# Patient Record
Sex: Female | Born: 1990 | Race: Black or African American | Hispanic: No | Marital: Single | Smoking: Current every day smoker
Health system: Southern US, Community
[De-identification: ages and names within clinical notes are randomized; demographics above are authoritative.]

## PROBLEM LIST (undated history)

## (undated) DIAGNOSIS — O2230 Deep phlebothrombosis in pregnancy, unspecified trimester: Secondary | ICD-10-CM

## (undated) DIAGNOSIS — F419 Anxiety disorder, unspecified: Secondary | ICD-10-CM

## (undated) HISTORY — PX: EYE SURGERY: SHX253

## (undated) HISTORY — PX: TONSILLECTOMY: SUR1361

---
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
## 2008-06-09 ENCOUNTER — Emergency Department (HOSPITAL_COMMUNITY): Admission: EM | Admit: 2008-06-09 | Discharge: 2008-06-09 | Payer: Self-pay | Admitting: Emergency Medicine

## 2008-08-25 ENCOUNTER — Emergency Department (HOSPITAL_COMMUNITY): Admission: EM | Admit: 2008-08-25 | Discharge: 2008-08-25 | Payer: Self-pay | Admitting: Emergency Medicine

## 2008-09-01 ENCOUNTER — Emergency Department (HOSPITAL_COMMUNITY): Admission: EM | Admit: 2008-09-01 | Discharge: 2008-09-01 | Payer: Self-pay | Admitting: Emergency Medicine

## 2008-11-02 ENCOUNTER — Emergency Department (HOSPITAL_COMMUNITY): Admission: EM | Admit: 2008-11-02 | Discharge: 2008-11-02 | Payer: Self-pay | Admitting: Family Medicine

## 2009-04-05 ENCOUNTER — Emergency Department (HOSPITAL_COMMUNITY): Admission: EM | Admit: 2009-04-05 | Discharge: 2009-04-05 | Payer: Self-pay | Admitting: Family Medicine

## 2010-01-13 ENCOUNTER — Emergency Department (HOSPITAL_COMMUNITY): Admission: EM | Admit: 2010-01-13 | Discharge: 2010-01-13 | Payer: Self-pay | Admitting: Emergency Medicine

## 2010-01-18 ENCOUNTER — Emergency Department (HOSPITAL_COMMUNITY): Admission: EM | Admit: 2010-01-18 | Discharge: 2010-01-18 | Payer: Self-pay | Admitting: Family Medicine

## 2011-01-22 ENCOUNTER — Inpatient Hospital Stay (INDEPENDENT_AMBULATORY_CARE_PROVIDER_SITE_OTHER)
Admission: RE | Admit: 2011-01-22 | Discharge: 2011-01-22 | Disposition: A | Discharge status: Home / Self Care | Payer: Self-pay | Source: Ambulatory Visit | Attending: Family Medicine | Admitting: Family Medicine

## 2011-01-22 ENCOUNTER — Other Ambulatory Visit: Payer: Self-pay | Admitting: Family Medicine

## 2011-01-22 DIAGNOSIS — A599 Trichomoniasis, unspecified: Secondary | ICD-10-CM

## 2011-01-22 DIAGNOSIS — N9489 Other specified conditions associated with female genital organs and menstrual cycle: Secondary | ICD-10-CM

## 2011-01-22 DIAGNOSIS — N760 Acute vaginitis: Secondary | ICD-10-CM

## 2011-01-24 LAB — POCT URINALYSIS DIPSTICK
Bilirubin Urine: NEGATIVE
Glucose, UA: NEGATIVE mg/dL
Ketones, ur: NEGATIVE mg/dL
pH: 7 (ref 5.0–8.0)

## 2011-01-24 LAB — URINE CULTURE

## 2011-02-09 LAB — POCT RAPID STREP A (OFFICE): Streptococcus, Group A Screen (Direct): NEGATIVE

## 2011-02-24 LAB — WET PREP, GENITAL: Yeast Wet Prep HPF POC: NONE SEEN

## 2011-02-24 LAB — GC/CHLAMYDIA PROBE AMP, GENITAL
Chlamydia, DNA Probe: NEGATIVE
GC Probe Amp, Genital: NEGATIVE

## 2011-04-08 NOTE — H&P (Signed)
NAME:  Swiech, Rickie          ACCOUNT NO.:  [id number]   MEDICAL RECORD NO.:  [id number]          PATIENT TYPE:  INP   LOCATION:  9164                          FACILITY:  [facility]   PHYSICIAN:  Janine Limbo, M.D.DATE OF BIRTH:  [birth year]   DATE OF ADMISSION:  11/13/2006  DATE OF DISCHARGE:                              HISTORY & PHYSICAL   HISTORY OF PRESENT ILLNESS:  Ms. Nancy Williamson is a 20 year old single black  female, primigravida at 40-6/7 weeks, who presents with regular uterine  contractions tonight.  She denies leaking and denies bleeding.  No signs  or symptoms of PIH.  No nausea, vomiting or diarrhea.  Her pregnancy has  been followed by the [facility] and remarkable for:  (1) Adolescent, (2) first trimester  Chlamydia, (3) group B strep positive.  Her prenatal labs were collected  on April 26, 2006, hemoglobin 11.8, hematocrit 37.8, platelets 390,000;  blood type O positive, antibody negative; sickle cell trait negative;  RPR nonreactive; rubella-immune; hepatitis B surface antigen negative;  HIV nonreactive; cystic fibrosis negative; Chlamydia was positive and GC  was negative.  One-hour Glucola from August 21, 2006 was 96.  Fetal  fibronectin from August 21, 2006 was negative; fetal fibronectin from  September 18, 2006 was negative.  Group B strep from October 02, 2006 was  positive.  GC and Chlamydia test-of-cures from May 31, 2006 were  negative.   HISTORY OF PRESENT PREGNANCY:  The patient presented for care at [facility] on April 26, 2006 at 11 weeks' gestation.  Nuchal translucency  ultrasound at 13 weeks' gestation was within normal limits.  Anatomy  ultrasound at 17 weeks' gestation shows growth consistent with previous  dating, confirming [facility] of November 07, 2006; that [facility] was determined by  the nuchal translucency ultrasound at 13 weeks.  The patient had been  treated for Chlamydia after her first visit with  1 g of Zithromax and  then she had a test-of-cure on July 11 that was negative.  The patient  had some cramping at 29 weeks and had a negative fetal fibronectin test.  The patient had ultrasound at 35 weeks' gestation due to decreased  weight gain; AFI was normal and growth was at the 27th to 31st  percentile.  Most of the measurements had the fetus at 33 weeks and the  femur length was measuring 36.  Plan was for ultrasound for growth in 2  weeks and fetal kick counts.  Ultrasound at 38 weeks showed estimated  fetal weight at the 9th to 10th percentile, normal fluid, BPP 8/8.  Plan  was for NST and then repeat ultrasound in a week; following the  ultrasound, the issue was resolved.  The rest of her prenatal care has  been unremarkable.   OBSTETRICAL HISTORY:  The patient is a primigravida.   ALLERGIES:  She has no medication allergies.   MEDICAL HISTORY:  She experienced menarche at the age of 56 with 28-day  cycles lasting 3-4 days.  She reports having had the usual childhood  illnesses.   SURGICAL HISTORY:  Remarkable for a  T&A as a 20 year old.   FAMILY MEDICAL HISTORY:  Remarkable for paternal grandmother with heart  disease, both grandmothers with hypertension, patient's mother with  IDDM, patient's mother with hypothyroidism, multiple family members with  migraines, patient's mother treated for depression.   GENETIC HISTORY:  Unremarkable.   SOCIAL HISTORY:  The patient is single.  Father of the baby's name is  Tykee.  The patient has 11 years of education and is employed part-time  in housekeeping.  Father of the baby has 11 years of education and is  employed part-time at [employer].  They deny any alcohol, tobacco or illicit  drug use with the pregnancy.   OBJECTIVE DATA:  VITAL SIGNS:  Stable.  She is afebrile.  HEENT:  Grossly within normal limits.  CHEST:  Clear to auscultation.  HEART:  Regular rate and rhythm.  ABDOMEN:  Gravid in contour with the fundal height  extending  approximately 4 cm above the pubic symphysis.  Fetal heart rate is  reactive and reassuring.  Contractions are 8-9 minutes and were 3-4  minutes prior to Stadol.  PELVIC:  Cervix is 3-4, 80%, vertex -2 and was 2+ an hour ago.  EXTREMITIES:  Normal.   ASSESSMENT:  1. Intrauterine pregnancy at term.  2. Early labor.  3. Group B streptococcus positive.   PLAN:  1. To admit to birthing suites.  2. Routine C.N.M. orders.  3. Patient plans epidural.  4. Begin penicillin G for group B strep prophylaxis.  5. Anticipate normal spontaneous vaginal birth.      Cam Hai, C.N.M.      Janine Limbo, M.D.  Electronically Signed    KS/MEDQ  D:  11/13/2006  T:  11/13/2006  Job:  [id number]

## 2011-05-10 ENCOUNTER — Inpatient Hospital Stay (INDEPENDENT_AMBULATORY_CARE_PROVIDER_SITE_OTHER)
Admission: RE | Admit: 2011-05-10 | Discharge: 2011-05-10 | Disposition: A | Discharge status: Home / Self Care | Payer: Self-pay | Source: Ambulatory Visit | Attending: Emergency Medicine | Admitting: Emergency Medicine

## 2011-05-10 DIAGNOSIS — R109 Unspecified abdominal pain: Secondary | ICD-10-CM

## 2011-05-10 LAB — POCT PREGNANCY, URINE: Preg Test, Ur: NEGATIVE

## 2011-05-10 LAB — POCT URINALYSIS DIP (DEVICE)
Hgb urine dipstick: NEGATIVE
Hgb urine dipstick: NEGATIVE
Ketones, ur: NEGATIVE mg/dL
Protein, ur: 30 mg/dL — AB
Protein, ur: 30 mg/dL — AB
Specific Gravity, Urine: 1.025 (ref 1.005–1.030)
Specific Gravity, Urine: >=1.03 (ref 1.005–1.030)
Urobilinogen, UA: 1 mg/dL (ref 0.0–1.0)
Urobilinogen, UA: 1 mg/dL (ref 0.0–1.0)
pH: 6.5 (ref 5.0–8.0)

## 2011-05-10 LAB — WET PREP, GENITAL

## 2011-05-11 LAB — GC/CHLAMYDIA PROBE AMP, GENITAL: GC Probe Amp, Genital: NEGATIVE

## 2011-08-19 LAB — POCT PREGNANCY, URINE
Operator id: 247071
Preg Test, Ur: NEGATIVE

## 2011-08-22 LAB — POCT I-STAT, CHEM 8
BUN: 18
Calcium, Ion: 1.25
Chloride: 106
Creatinine, Ser: 1
Glucose, Bld: 80
Potassium: 4.6

## 2011-08-22 LAB — DIFFERENTIAL
Basophils Relative: 1
Lymphs Abs: 2.9
Monocytes Absolute: 0.3
Monocytes Relative: 5
Neutro Abs: 2.6
Neutrophils Relative %: 45

## 2011-08-22 LAB — URINALYSIS, ROUTINE W REFLEX MICROSCOPIC
Bilirubin Urine: NEGATIVE
Hgb urine dipstick: NEGATIVE
Ketones, ur: NEGATIVE
Nitrite: NEGATIVE
Urobilinogen, UA: 0.2
pH: 7

## 2011-08-22 LAB — URINE MICROSCOPIC-ADD ON

## 2011-08-22 LAB — CBC
Hemoglobin: 11.9 — ABNORMAL LOW
MCHC: 31.9
RBC: 4.9
WBC: 5.9

## 2011-09-01 LAB — RAPID URINE DRUG SCREEN, HOSP PERFORMED
Barbiturates: NOT DETECTED
Cocaine: NOT DETECTED

## 2011-09-01 LAB — PROTIME-INR
INR: 0.9
Prothrombin Time: 12.4

## 2011-09-01 LAB — URINE MICROSCOPIC-ADD ON

## 2011-09-01 LAB — URINALYSIS, ROUTINE W REFLEX MICROSCOPIC
Bilirubin Urine: NEGATIVE
Glucose, UA: NEGATIVE
Hgb urine dipstick: NEGATIVE
Ketones, ur: NEGATIVE
Nitrite: NEGATIVE
Specific Gravity, Urine: 1.017
pH: 6.5

## 2011-09-01 LAB — COMPREHENSIVE METABOLIC PANEL
ALT: 17
Alkaline Phosphatase: 68
BUN: 23
CO2: 26
GFR calc non Af Amer: >60
Glucose, Bld: 90
Potassium: 3.4 — ABNORMAL LOW
Sodium: 135
Total Bilirubin: 0.6

## 2011-09-01 LAB — POCT PREGNANCY, URINE: Operator id: 277751

## 2011-09-01 LAB — ETHANOL: Alcohol, Ethyl (B): <5

## 2011-09-01 LAB — CBC
HCT: 36.5
Hemoglobin: 12
RBC: 4.95

## 2011-09-01 LAB — DIFFERENTIAL
Basophils Absolute: 0
Basophils Relative: 1
Eosinophils Absolute: 0.1
Neutrophils Relative %: 59

## 2011-09-01 LAB — ACETAMINOPHEN LEVEL: Acetaminophen (Tylenol), Serum: <10 — ABNORMAL LOW

## 2011-09-01 LAB — SALICYLATE LEVEL: Salicylate Lvl: 4.6

## 2011-10-17 ENCOUNTER — Emergency Department (INDEPENDENT_AMBULATORY_CARE_PROVIDER_SITE_OTHER)
Admission: EM | Admit: 2011-10-17 | Discharge: 2011-10-17 | Disposition: A | Discharge status: Home / Self Care | Payer: Self-pay | Source: Home / Self Care

## 2011-10-17 DIAGNOSIS — N760 Acute vaginitis: Secondary | ICD-10-CM

## 2011-10-17 DIAGNOSIS — M25559 Pain in unspecified hip: Secondary | ICD-10-CM

## 2011-10-17 DIAGNOSIS — M25552 Pain in left hip: Secondary | ICD-10-CM

## 2011-10-17 DIAGNOSIS — R0789 Other chest pain: Secondary | ICD-10-CM

## 2011-10-17 DIAGNOSIS — R071 Chest pain on breathing: Secondary | ICD-10-CM

## 2011-10-17 LAB — WET PREP, GENITAL
Trich, Wet Prep: NONE SEEN
Yeast Wet Prep HPF POC: NONE SEEN

## 2011-10-17 LAB — POCT PREGNANCY, URINE: Preg Test, Ur: NEGATIVE

## 2011-10-17 LAB — POCT URINALYSIS DIP (DEVICE)
Hgb urine dipstick: NEGATIVE
Ketones, ur: NEGATIVE mg/dL
Protein, ur: 100 mg/dL — AB
Specific Gravity, Urine: >=1.03 (ref 1.005–1.030)
pH: 6.5 (ref 5.0–8.0)

## 2011-10-17 MED ORDER — CEFTRIAXONE SODIUM 250 MG IJ SOLR
INTRAMUSCULAR | Status: AC
  Filled 2011-10-17: qty 250

## 2011-10-17 MED ORDER — LIDOCAINE HCL (PF) 1 % IJ SOLN
INTRAMUSCULAR | Status: AC
  Filled 2011-10-17: qty 5

## 2011-10-17 MED ORDER — CEFTRIAXONE SODIUM 250 MG IJ SOLR
250.0000 mg | Freq: Once | INTRAMUSCULAR | Status: AC
  Administered 2011-10-17: 250 mg via INTRAMUSCULAR

## 2011-10-17 MED ORDER — DOXYCYCLINE HYCLATE 100 MG PO CAPS: 100.0000 mg | ORAL_CAPSULE | Freq: Two times a day (BID) | ORAL | Status: AC

## 2011-10-17 NOTE — ED Notes (Signed)
Condoms and implanon for [facility];  Implanon x 2 yrs  (MD at [facility] hosp placed device), wonders if it needs to be replaced ; lower abdominal pain pain into back and in to chest "pulling in her heart", vaginal d/c changed

## 2011-10-17 NOTE — ED Provider Notes (Signed)
History     CSN: [id number] Arrival date & time: 10/17/2011  2:20 PM   None     Chief Complaint  Patient presents with  . Menstrual Problem    (Consider location/radiation/quality/duration/timing/severity/associated sxs/prior treatment) HPI Comments: Pt presents with multiple complaints. She complains of pain bilat pelvic bones intermittently x 1 mos - she points to bilat iliac crests. Improves with rest. Worse at night. Not associated with any particular activity. No radiation of pain. Also pain Lt lower chest pain x 2 weeks. She can feel is pop back in place. Occurs with movement, and certain positions. Denies injury. Also c/o irregular vaginal bleeding. Menses usually regular and once a month even with Implanon. Last month and this month menses have been on time, regular flow, but have lasted a couple more days than usual. Then this morning noticed some vaginal bleeding again - has stopped. Has had thick white vag dicharge x 1 mos. No pain, odor or irritation.   The history is provided by the patient.    History reviewed. No pertinent past medical history.  Past Surgical History  Procedure Date  . Tonsillectomy     History reviewed. No pertinent family history.  History  Substance Use Topics  . Smoking status: Current Everyday Smoker  . Smokeless tobacco: Not on file  . Alcohol Use: Yes     occasionaly    OB History    Grav Para Term Preterm Abortions TAB SAB Ect Mult Living                  Review of Systems  Constitutional: Negative for fever and chills.  Respiratory: Positive for cough (improving). Negative for shortness of breath and wheezing.   Cardiovascular: Negative for chest pain.  Gastrointestinal: Negative for nausea, vomiting, abdominal pain, diarrhea and constipation.  Genitourinary: Positive for vaginal discharge. Negative for dysuria, frequency, vaginal bleeding, vaginal pain and pelvic pain.    Allergies  Review of patient's allergies indicates  no known allergies.  Home Medications  No current outpatient prescriptions on file.  BP 123/75  Pulse 100  Temp(Src) 98.3 F (36.8 C) (Oral)  Resp 10  SpO2 100%  Physical Exam  Nursing note and vitals reviewed. Constitutional: She appears well-developed and well-nourished. No distress.  Neck: Neck supple.  Cardiovascular: Normal rate and regular rhythm.   Pulmonary/Chest: Effort normal. No respiratory distress.  Abdominal: Soft. Bowel sounds are normal. She exhibits no mass. There is no tenderness.  Genitourinary: Uterus normal. There is no rash, tenderness or lesion on the right labia. There is no rash, tenderness or lesion on the left labia. Cervix exhibits discharge (small amount of yellow discharge at cervical os). Cervix exhibits no motion tenderness and no friability. Right adnexum displays tenderness (mild). Right adnexum displays no mass and no fullness. Left adnexum displays tenderness (mild). Left adnexum displays no mass and no fullness. There is bleeding (small amount of blood noted ) around the vagina. No erythema or tenderness around the vagina. No signs of injury around the vagina. No vaginal discharge found.  Lymphadenopathy:    She has no cervical adenopathy.  Skin: Skin is warm and dry.  Psychiatric: She has a normal mood and affect.    ED Course  Procedures (including critical care time)  Labs Reviewed  POCT URINALYSIS DIP (DEVICE) - Abnormal; Notable for the following:    Protein, ur 100 (*)    Leukocytes, UA TRACE (*) Biochemical Testing Only. Please order routine urinalysis from main lab if confirmatory  testing is needed.   All other components within normal limits  POCT PREGNANCY, URINE  POCT PREGNANCY, URINE  POCT URINALYSIS DIPSTICK  GC/CHLAMYDIA PROBE AMP, GENITAL  WET PREP, GENITAL   No results found.   No diagnosis found.    MDM  UA dip & preg neg. GC/chlamydia and wet prep pending.         Melody Comas, [place] 10/17/11 1606

## 2011-10-17 NOTE — ED Notes (Signed)
Patient called, states that [facility] has no record of her e-scribed RX; called and spoke directly w pharmacy staff and gave a repeat phone order for mesd, patient informed

## 2011-10-18 ENCOUNTER — Telehealth (HOSPITAL_COMMUNITY): Payer: Self-pay | Admitting: *Deleted

## 2011-10-18 NOTE — ED Notes (Signed)
When pt. said her partner was treated here but was not contacted. I explained we contact all pt.'s that are positive for STD's. The only time is if we have an incorrect phone number or address. I told her we try 3 times by phone before we send a letter. Nancy Williamson 10/18/2011

## 2011-10-18 NOTE — ED Notes (Signed)
Labs and medications reviewed. The patient adequately treated with Rocephin for GC and Rx. Doxycycline for Chlamydia. DHHS form completed and faxed to the [facility]. Vassie Moselle 10/18/2011

## 2011-10-20 NOTE — ED Provider Notes (Signed)
Medical screening examination/treatment/procedure(s) were performed by non-physician practitioner and as supervising physician I was immediately available for consultation/collaboration.  Venesha Petraitis G  D.O.    Donatello Kleve G Cavion Faiola, MD 10/20/11 0831 

## 2011-12-28 ENCOUNTER — Encounter (HOSPITAL_COMMUNITY): Payer: Self-pay | Admitting: Emergency Medicine

## 2011-12-28 ENCOUNTER — Emergency Department (INDEPENDENT_AMBULATORY_CARE_PROVIDER_SITE_OTHER)
Admission: EM | Admit: 2011-12-28 | Discharge: 2011-12-28 | Disposition: A | Discharge status: Home / Self Care | Payer: Self-pay | Source: Home / Self Care | Attending: Emergency Medicine | Admitting: Emergency Medicine

## 2011-12-28 DIAGNOSIS — L0291 Cutaneous abscess, unspecified: Secondary | ICD-10-CM

## 2011-12-28 DIAGNOSIS — L0390 Cellulitis, unspecified: Secondary | ICD-10-CM

## 2011-12-28 MED ORDER — SULFAMETHOXAZOLE-TMP DS 800-160 MG PO TABS: 2.0000 | ORAL_TABLET | Freq: Two times a day (BID) | ORAL | Status: AC

## 2011-12-28 MED ORDER — TRAMADOL HCL 50 MG PO TABS: 100.0000 mg | ORAL_TABLET | Freq: Three times a day (TID) | ORAL | Status: AC | PRN

## 2011-12-28 MED ORDER — HYDROCODONE-ACETAMINOPHEN 5-325 MG PO TABS
ORAL_TABLET | ORAL | Status: AC
  Filled 2011-12-28: qty 1

## 2011-12-28 MED ORDER — HYDROCODONE-ACETAMINOPHEN 5-325 MG PO TABS
1.0000 | ORAL_TABLET | Freq: Once | ORAL | Status: AC
  Administered 2011-12-28: 1 via ORAL

## 2011-12-28 NOTE — ED Notes (Signed)
Waiting for ride prior to medicating w narcotic

## 2011-12-28 NOTE — ED Notes (Signed)
C/o hard knot on upper thigh for several days; no relief w hot compresses

## 2011-12-28 NOTE — ED Provider Notes (Signed)
Chief Complaint  Patient presents with  . Skin Ulcer    History of Present Illness:  The patient has had a one-week history of an abscess on her right buttock, just anterior to the anus. This has not drained at all. She has had abscesses in the past that have required incision and drainage. She has had no history of MRSA or diabetes.  Review of Systems:  Other than noted above, the patient denies any of the following symptoms: Systemic:  No fever, chills or sweats. Skin:  No rash or itching.  PMFSH:  Past medical history, family history, social history, meds, and allergies were reviewed.  Physical Exam:   Vital signs:  BP 114/68  Pulse 99  Temp(Src) 98.6 F (37 C) (Oral)  Resp 16  SpO2 97% Skin:  She has a 2 cm, fluctuant abscess on the right buttock just anterior to the anus.  Otherwise normal.  No rash.  Procedure:  Verbal informed consent was obtained.  The patient was informed of the risks and benefits of the procedure and understands and accepts.  Identity of the patient was verified verbally and by wristband.   The abscess area described above was prepped with Betadine and alcohol and anesthetized with 3 mL of 2% Xylocaine with epinephrine.  Using a #11 scalpel blade, a singe straight incision was made into the area of fluctulence, yielding a large amount of prurulent drainage.  Routine cultures were obtained.  Blunt dissection was used to break up loculations and the resulting wound cavity was packed with 1/4 inch Iodoform gauze.  A sterile pressure dressing was applied.  Assessment:   Diagnoses that have been ruled out:  None  Diagnoses that are still under consideration:  None  Final diagnoses:  Abscess    Plan:   1.  The following meds were prescribed:   New Prescriptions   SULFAMETHOXAZOLE-TRIMETHOPRIM (BACTRIM DS) 800-160 MG PER TABLET    Take 2 tablets by mouth 2 (two) times daily.   TRAMADOL (ULTRAM) 50 MG TABLET    Take 2 tablets (100 mg total) by mouth every 8  (eight) hours as needed for pain.   2.  The patient was instructed in symptomatic care and handouts were given. 3.  The patient was instructed to leave the dressing in place and return again in 48 hours for packing removal.  Roque Lias, MD 12/28/11 [phone]

## 2011-12-30 ENCOUNTER — Emergency Department (INDEPENDENT_AMBULATORY_CARE_PROVIDER_SITE_OTHER)
Admission: EM | Admit: 2011-12-30 | Discharge: 2011-12-30 | Disposition: A | Discharge status: Home / Self Care | Payer: Self-pay | Source: Home / Self Care | Attending: Emergency Medicine | Admitting: Emergency Medicine

## 2011-12-30 ENCOUNTER — Encounter (HOSPITAL_COMMUNITY): Payer: Self-pay

## 2011-12-30 DIAGNOSIS — L0231 Cutaneous abscess of buttock: Secondary | ICD-10-CM

## 2011-12-30 MED ORDER — HYDROCODONE-ACETAMINOPHEN 5-325 MG PO TABS
2.0000 | ORAL_TABLET | Freq: Once | ORAL | Status: AC
  Administered 2011-12-30: 2 via ORAL

## 2011-12-30 NOTE — ED Notes (Signed)
Seen here 2 days ago for I&D of abscess post. Aspect of rt thigh.  Here today for recheck and packing removal.

## 2011-12-30 NOTE — ED Provider Notes (Signed)
History     CSN: [id number]  Arrival date & time 12/30/11  1035   First MD Initiated Contact with Patient 12/30/11 1203      Chief Complaint  Patient presents with  . Wound Check    (Consider location/radiation/quality/duration/timing/severity/associated sxs/prior treatment) HPI Comments: Here to have packing removal, taken antibiotics as prescribed " its still hurting"  Patient is a 21 y.o. female presenting with wound check. The history is provided by the patient.  Wound Check  She was treated in the ED 2 to 3 days ago. Previous treatment in the ED includes I&D of abscess. Treatments since wound repair include oral antibiotics. Her temperature was unmeasured prior to arrival. There has been no drainage from the wound. There is no redness present. The pain has improved. There is difficulty moving the extremity or digit due to pain.    History reviewed. No pertinent past medical history.  Past Surgical History  Procedure Date  . Tonsillectomy     No family history on file.  History  Substance Use Topics  . Smoking status: Current Everyday Smoker  . Smokeless tobacco: Not on file  . Alcohol Use: Yes     occasionaly    OB History    Grav Para Term Preterm Abortions TAB SAB Ect Mult Living                  Review of Systems  Constitutional: Negative for fever.    Allergies  Review of patient's allergies indicates no known allergies.  Home Medications   Current Outpatient Rx  Name Route Sig Dispense Refill  . SULFAMETHOXAZOLE-TMP DS 800-160 MG PO TABS Oral Take 2 tablets by mouth 2 (two) times daily. 40 tablet 0  . TRAMADOL HCL 50 MG PO TABS Oral Take 2 tablets (100 mg total) by mouth every 8 (eight) hours as needed for pain. 30 tablet 0    BP 106/69  Pulse 80  Temp(Src) 98.1 F (36.7 C) (Oral)  Resp 16  SpO2 100%  LMP 12/01/2011  Physical Exam  Constitutional: She is oriented to person, place, and time. She appears well-developed and well-nourished. No  distress.  Abdominal: Soft.  Neurological: She is oriented to person, place, and time.  Skin: Skin is warm. No erythema.       ED Course  Procedures (including critical care time)  Labs Reviewed - No data to display No results found.   1. Abscess of buttock, right       MDM  Removed packing, no residual  discharge        Jimmie Molly, MD 12/30/11 1821

## 2011-12-31 LAB — CULTURE, ROUTINE-ABSCESS

## 2012-05-09 ENCOUNTER — Encounter (HOSPITAL_COMMUNITY): Payer: Self-pay | Admitting: Emergency Medicine

## 2012-05-09 ENCOUNTER — Emergency Department (HOSPITAL_COMMUNITY)
Admission: EM | Admit: 2012-05-09 | Discharge: 2012-05-09 | Disposition: A | Discharge status: Home / Self Care | Payer: Self-pay | Attending: Emergency Medicine | Admitting: Emergency Medicine

## 2012-05-09 ENCOUNTER — Emergency Department (HOSPITAL_COMMUNITY): Payer: Self-pay

## 2012-05-09 ENCOUNTER — Emergency Department (HOSPITAL_COMMUNITY)
Admission: EM | Admit: 2012-05-09 | Discharge: 2012-05-09 | Disposition: A | Discharge status: Home / Self Care | Payer: Self-pay | Source: Home / Self Care | Attending: Emergency Medicine | Admitting: Emergency Medicine

## 2012-05-09 DIAGNOSIS — F411 Generalized anxiety disorder: Secondary | ICD-10-CM | POA: Insufficient documentation

## 2012-05-09 DIAGNOSIS — R1013 Epigastric pain: Secondary | ICD-10-CM | POA: Insufficient documentation

## 2012-05-09 DIAGNOSIS — F17200 Nicotine dependence, unspecified, uncomplicated: Secondary | ICD-10-CM | POA: Insufficient documentation

## 2012-05-09 HISTORY — DX: Anxiety disorder, unspecified: F41.9

## 2012-05-09 LAB — LIPASE, BLOOD: Lipase: 35 U/L (ref 11–59)

## 2012-05-09 LAB — DIFFERENTIAL
Basophils Absolute: 0 10*3/uL (ref 0.0–0.1)
Eosinophils Relative: 1 % (ref 0–5)
Lymphocytes Relative: 41 % (ref 12–46)
Lymphs Abs: 3.2 10*3/uL (ref 0.7–4.0)
Neutro Abs: 4 10*3/uL (ref 1.7–7.7)
Neutrophils Relative %: 52 % (ref 43–77)

## 2012-05-09 LAB — URINALYSIS, ROUTINE W REFLEX MICROSCOPIC
Glucose, UA: NEGATIVE mg/dL
Ketones, ur: NEGATIVE mg/dL
Nitrite: NEGATIVE
Specific Gravity, Urine: 1.036 — ABNORMAL HIGH (ref 1.005–1.030)
pH: 6 (ref 5.0–8.0)

## 2012-05-09 LAB — PREGNANCY, URINE: Preg Test, Ur: NEGATIVE

## 2012-05-09 LAB — CBC
Platelets: 333 10*3/uL (ref 150–400)
RBC: 4.73 MIL/uL (ref 3.87–5.11)
RDW: 14.2 % (ref 11.5–15.5)
WBC: 7.7 10*3/uL (ref 4.0–10.5)

## 2012-05-09 LAB — COMPREHENSIVE METABOLIC PANEL
AST: 16 U/L (ref 0–37)
Albumin: 3.7 g/dL (ref 3.5–5.2)
Alkaline Phosphatase: 53 U/L (ref 39–117)
BUN: 17 mg/dL (ref 6–23)
Potassium: 4.1 mEq/L (ref 3.5–5.1)
Total Protein: 7.2 g/dL (ref 6.0–8.3)

## 2012-05-09 LAB — URINE MICROSCOPIC-ADD ON

## 2012-05-09 MED ORDER — GI COCKTAIL ~~LOC~~
30.0000 mL | Freq: Once | ORAL | Status: AC
  Administered 2012-05-09: 30 mL via ORAL
  Filled 2012-05-09: qty 30

## 2012-05-09 MED ORDER — RANITIDINE HCL 150 MG PO TABS: 150.0000 mg | ORAL_TABLET | Freq: Two times a day (BID) | ORAL | Status: DC

## 2012-05-09 MED ORDER — PEPCID 20 MG PO TABS: 20.0000 mg | ORAL_TABLET | Freq: Two times a day (BID) | ORAL | Status: DC

## 2012-05-09 MED ORDER — ONDANSETRON HCL 4 MG/2ML IJ SOLN: 4.0000 mg | Freq: Once | INTRAMUSCULAR | Status: DC

## 2012-05-09 MED ORDER — SODIUM CHLORIDE 0.9 % IV BOLUS (SEPSIS): 1000.0000 mL | Freq: Once | INTRAVENOUS | Status: DC

## 2012-05-09 MED ORDER — HYDROMORPHONE HCL PF 1 MG/ML IJ SOLN: 1.0000 mg | Freq: Once | INTRAMUSCULAR | Status: DC

## 2012-05-09 NOTE — Discharge Instructions (Signed)
Nancy Williamson the ultra sound of your abdomen does not show gallbladder dz.  I believe you are having GERD or indigestion.  Start taking pepcid over the counter x 5-10 days and see if the pain improves.  You could also try zantac 150mg  /day for the pain.  Stay away from greasy food.  Increase your water intake. You are dehydrated. Return to the ER for severe pain or uncontrolled nausea and vomiting.

## 2012-05-09 NOTE — ED Notes (Signed)
Pt reports upper abdominal pain onset this morning with nausea. Pt took 2 advil PM this morning.

## 2012-05-09 NOTE — ED Notes (Signed)
Pt currently in ultrasound

## 2012-05-09 NOTE — ED Notes (Signed)
Pt unhooked from monitor, told she can get dressed and nurse will be in shortly to go over her discharge paper work.

## 2012-05-09 NOTE — ED Provider Notes (Signed)
History     CSN: [id number]  Arrival date & time 05/09/12  1150   First MD Initiated Contact with Patient 05/09/12 1546      Chief Complaint  Patient presents with  . Abdominal Pain    (Consider location/radiation/quality/duration/timing/severity/associated sxs/prior treatment) Patient is a 21 y.o. female presenting with abdominal pain. The history is provided by the patient. No language interpreter was used.  Abdominal Pain The primary symptoms of the illness include abdominal pain, nausea and vaginal bleeding. The primary symptoms of the illness do not include fever, shortness of breath, vomiting, diarrhea, dysuria or vaginal discharge. The current episode started more than 2 days ago. The onset of the illness was sudden. The problem has been resolved.  Additional symptoms associated with the illness include back pain. Symptoms associated with the illness do not include chills, diaphoresis, constipation, urgency, hematuria or frequency. Significant associated medical issues do not include inflammatory bowel disease, diabetes, substance abuse or diverticulitis.   21 year old female complaining of right upper quadrant pain that is intermittent times one year and epigastric pain that is intermittent times one year. Patient states that she awoke this morning with a sharp epigastric pain that went through to her back. States she had nausea but no vomiting. Patient states her last period was a week ago and she is having vaginal bleeding today. She uses implinon for birth control but it has been in her left upper extremity for 6 years. States that she's been waiting for 5 hours and took 2 Advil PM 6 with relief in the waiting room. States now she just has right upper quadrant pain with palpitation and general abdominal pain feels sore. Last bowel movement was normal with no blood yesterday. Patient ate a sandwich 2 hours into her weight in the ER. She has been to the doctor multiple times and they tell  her nothing is wrong.  Past Medical History  Diagnosis Date  . Anxiety     Past Surgical History  Procedure Date  . Tonsillectomy   . Eye surgery     History reviewed. No pertinent family history.  History  Substance Use Topics  . Smoking status: Current Everyday Smoker  . Smokeless tobacco: Not on file  . Alcohol Use: Yes     occasionaly    OB History    Grav Para Term Preterm Abortions TAB SAB Ect Mult Living                  Review of Systems  Constitutional: Negative.  Negative for fever, chills and diaphoresis.  HENT: Negative.   Eyes: Negative.   Respiratory: Negative.  Negative for cough and shortness of breath.   Cardiovascular: Negative.  Negative for chest pain and leg swelling.  Gastrointestinal: Positive for nausea and abdominal pain. Negative for vomiting, diarrhea and constipation.  Genitourinary: Positive for vaginal bleeding. Negative for dysuria, urgency, frequency, hematuria, flank pain, vaginal discharge and difficulty urinating.  Musculoskeletal: Positive for back pain.  Neurological: Negative.  Negative for dizziness, weakness and light-headedness.  Psychiatric/Behavioral: Negative.   All other systems reviewed and are negative.    Allergies  Review of patient's allergies indicates no known allergies.  Home Medications  No current outpatient prescriptions on file.  BP 107/70  Pulse 76  Temp 98.4 F (36.9 C) (Oral)  Resp 18  SpO2 99%  Physical Exam  Nursing note and vitals reviewed. Constitutional: She is oriented to person, place, and time. She appears well-developed and well-nourished.  HENT:  Head:  Normocephalic and atraumatic.  Eyes: Conjunctivae and EOM are normal. Pupils are equal, round, and reactive to light.  Neck: Normal range of motion. Neck supple.  Cardiovascular: Normal rate.   Pulmonary/Chest: Effort normal.  Abdominal: Soft. Bowel sounds are normal. She exhibits no distension. There is tenderness. There is no  rebound.  Musculoskeletal: Normal range of motion. She exhibits no edema and no tenderness.  Neurological: She is alert and oriented to person, place, and time. She has normal reflexes.  Skin: Skin is warm and dry.  Psychiatric: She has a normal mood and affect.    ED Course  Procedures (including critical care time)   Labs Reviewed  CBC  DIFFERENTIAL  URINALYSIS, ROUTINE W REFLEX MICROSCOPIC  PREGNANCY, URINE  COMPREHENSIVE METABOLIC PANEL  LIPASE, BLOOD   No results found.   No diagnosis found.    MDM  Report called to Natalia Leatherwood in CDU where patient will be moved awaiting her abdominal u/s to r/o gallbladder.          Remi Haggard, NP 05/09/12 1753  Remi Haggard, NP 05/10/12 1245

## 2012-05-09 NOTE — ED Notes (Signed)
Family at bedside. 

## 2012-05-09 NOTE — ED Notes (Addendum)
Pt states that she has been having generalized pain in her stomach and mid chest, ot also states that she was nauseated. pt stated that she took 2 advil pms today around noon to help with the pain, pt currently not c/o any pain.

## 2012-05-10 LAB — URINE CULTURE: Culture  Setup Time: 201306191720

## 2012-05-11 NOTE — ED Provider Notes (Signed)
Medical screening examination/treatment/procedure(s) were conducted as a shared visit with non-physician practitioner(s) and myself.  I personally evaluated the patient during the encounter 21 yo woman complaining of RUQ pain.  Getting abdominal US to check for GB disease.  Otherwise, can be treated for gastritis.  Carleene Cooper III, MD 05/11/12 1350

## 2012-05-27 ENCOUNTER — Encounter (HOSPITAL_COMMUNITY): Payer: Self-pay | Admitting: Obstetrics and Gynecology

## 2012-05-27 ENCOUNTER — Inpatient Hospital Stay (HOSPITAL_COMMUNITY)
Admission: AD | Admit: 2012-05-27 | Discharge: 2012-05-27 | Disposition: A | Discharge status: Home / Self Care | Payer: Self-pay | Source: Ambulatory Visit | Attending: Obstetrics & Gynecology | Admitting: Obstetrics & Gynecology

## 2012-05-27 ENCOUNTER — Inpatient Hospital Stay (HOSPITAL_COMMUNITY): Payer: Self-pay

## 2012-05-27 DIAGNOSIS — N898 Other specified noninflammatory disorders of vagina: Secondary | ICD-10-CM

## 2012-05-27 DIAGNOSIS — N949 Unspecified condition associated with female genital organs and menstrual cycle: Secondary | ICD-10-CM | POA: Insufficient documentation

## 2012-05-27 DIAGNOSIS — N938 Other specified abnormal uterine and vaginal bleeding: Secondary | ICD-10-CM | POA: Insufficient documentation

## 2012-05-27 DIAGNOSIS — A599 Trichomoniasis, unspecified: Secondary | ICD-10-CM

## 2012-05-27 DIAGNOSIS — N83209 Unspecified ovarian cyst, unspecified side: Secondary | ICD-10-CM

## 2012-05-27 DIAGNOSIS — N939 Abnormal uterine and vaginal bleeding, unspecified: Secondary | ICD-10-CM

## 2012-05-27 LAB — CBC
MCH: 24.2 pg — ABNORMAL LOW (ref 26.0–34.0)
MCHC: 32.3 g/dL (ref 30.0–36.0)
Platelets: 297 10*3/uL (ref 150–400)
RDW: 14.4 % (ref 11.5–15.5)

## 2012-05-27 LAB — URINALYSIS, ROUTINE W REFLEX MICROSCOPIC
Glucose, UA: NEGATIVE mg/dL
Leukocytes, UA: NEGATIVE
pH: 6 (ref 5.0–8.0)

## 2012-05-27 LAB — URINE MICROSCOPIC-ADD ON

## 2012-05-27 LAB — POCT PREGNANCY, URINE: Preg Test, Ur: NEGATIVE

## 2012-05-27 MED ORDER — METRONIDAZOLE 500 MG PO TABS: 500.0000 mg | ORAL_TABLET | Freq: Two times a day (BID) | ORAL | Status: AC

## 2012-05-27 MED ORDER — KETOROLAC TROMETHAMINE 60 MG/2ML IM SOLN
60.0000 mg | Freq: Once | INTRAMUSCULAR | Status: AC
  Administered 2012-05-27: 60 mg via INTRAMUSCULAR
  Filled 2012-05-27: qty 2

## 2012-05-27 NOTE — MAU Provider Note (Signed)
History     CSN: [id number]  Arrival date & time 05/27/12  1105   None     Chief Complaint  Patient presents with  . Vaginal Bleeding    HPI Nancy Williamson is a 21 y.o. female who presents to MAU for vaginal bleeding. The bleeding started 2 weeks ago. Last normal period was in May. Complains of feeling tired, bleeding is heavier than a normal period. Using tampon and pad at the same time and using 15 per day. Abdominal pain started 2 weeks ago also. Had similar problem once before this year but not this bad. Last pap smear 3 years ago and was normal.Hadd implant for birthcontrol placed 2007. The history was provided by the patient.  Past Medical History  Diagnosis Date  . Anxiety     Past Surgical History  Procedure Date  . Tonsillectomy   . Eye surgery     History reviewed. No pertinent family history.  History  Substance Use Topics  . Smoking status: Current Everyday Smoker  . Smokeless tobacco: Not on file  . Alcohol Use: Yes     occasionaly    OB History    Grav Para Term Preterm Abortions TAB SAB Ect Mult Living   1 1 1       1       Review of Systems  Constitutional: Positive for chills. Negative for fever, diaphoresis and fatigue.  HENT: Negative for ear pain, congestion, sore throat, facial swelling, neck pain, neck stiffness, dental problem and sinus pressure.   Eyes: Negative for photophobia, pain, discharge and visual disturbance.  Respiratory: Negative for cough and wheezing.   Gastrointestinal: Positive for abdominal pain. Negative for nausea, vomiting, diarrhea, constipation and abdominal distention.  Genitourinary: Positive for vaginal bleeding and pelvic pain. Negative for dysuria, frequency, flank pain, vaginal discharge and difficulty urinating.  Musculoskeletal: Positive for back pain. Negative for myalgias and gait problem.  Skin: Negative for color change and rash.  Neurological: Positive for headaches. Negative for dizziness, speech  difficulty, weakness, light-headedness and numbness.  Psychiatric/Behavioral: Negative for confusion and agitation. The patient is not nervous/anxious (history of anxiety and depression).     Allergies  Review of patient's allergies indicates no known allergies.  Home Medications  No current outpatient prescriptions on file.  BP 125/92  Pulse 90  Temp 99.1 F (37.3 C) (Oral)  Resp 18  Ht 5\' 4"  (1.626 m)  Wt 124 lb 9.6 oz (56.518 kg)  BMI 21.39 kg/m2  LMP 05/07/2012  Physical Exam  Nursing note and vitals reviewed. Constitutional: She is oriented to person, place, and time. She appears well-developed and well-nourished. No distress.  HENT:  Head: Normocephalic.  Eyes: EOM are normal.  Neck: Neck supple.  Cardiovascular: Normal rate.   Pulmonary/Chest: Effort normal.  Abdominal: Soft. There is tenderness in the left lower quadrant. There is no rigidity, no rebound, no guarding and no CVA tenderness.  Genitourinary:       External genitalia without lesions. Frothy bloody discharge vaginal vault. Left adnexal tenderness, positive CMT. Uterus without palpable enlargement.   Musculoskeletal: Normal range of motion.  Neurological: She is alert and oriented to person, place, and time. No cranial nerve deficit.  Skin: Skin is warm and dry.  Psychiatric: She has a normal mood and affect. Her behavior is normal. Judgment and thought content normal.   Results for orders placed during the hospital encounter of 05/27/12 (from the past 24 hour(s))  URINALYSIS, ROUTINE W REFLEX MICROSCOPIC  Status: Abnormal   Collection Time   05/27/12 11:20 AM      Component Value Range   Color, Urine YELLOW  YELLOW   APPearance CLEAR  CLEAR   Specific Gravity, Urine 1.020  1.005 - 1.030   pH 6.0  5.0 - 8.0   Glucose, UA NEGATIVE  NEGATIVE mg/dL   Hgb urine dipstick LARGE (*) NEGATIVE   Bilirubin Urine NEGATIVE  NEGATIVE   Ketones, ur NEGATIVE  NEGATIVE mg/dL   Protein, ur NEGATIVE  NEGATIVE  mg/dL   Urobilinogen, UA 0.2  0.0 - 1.0 mg/dL   Nitrite NEGATIVE  NEGATIVE   Leukocytes, UA NEGATIVE  NEGATIVE  URINE MICROSCOPIC-ADD ON     Status: Abnormal   Collection Time   05/27/12 11:20 AM      Component Value Range   Squamous Epithelial / LPF FEW (*) RARE   WBC, UA 3-6  <3 WBC/hpf   RBC / HPF 21-50  <3 RBC/hpf   Bacteria, UA RARE  RARE  POCT PREGNANCY, URINE     Status: Normal   Collection Time   05/27/12 11:27 AM      Component Value Range   Preg Test, Ur NEGATIVE  NEGATIVE  CBC     Status: Abnormal   Collection Time   05/27/12 12:39 PM      Component Value Range   WBC 7.5  4.0 - 10.5 K/uL   RBC 4.54  3.87 - 5.11 MIL/uL   Hemoglobin 11.0 (*) 12.0 - 15.0 g/dL   HCT 91.4 (*) 78.2 - 95.6 %   MCV 75.1 (*) 78.0 - 100.0 fL   MCH 24.2 (*) 26.0 - 34.0 pg   MCHC 32.3  30.0 - 36.0 g/dL   RDW 21.3  08.6 - 57.8 %   Platelets 297  150 - 400 K/uL  WET PREP, GENITAL     Status: Abnormal   Collection Time   05/27/12 12:54 PM      Component Value Range   Yeast Wet Prep HPF POC NONE SEEN  NONE SEEN   Trich, Wet Prep FEW (*) NONE SEEN   Clue Cells Wet Prep HPF POC FEW (*) NONE SEEN   WBC, Wet Prep HPF POC FEW (*) NONE SEEN   US Transvaginal Non-ob  05/27/2012  *RADIOLOGY REPORT*  Clinical Data: Abnormal uterine bleeding.  TRANSABDOMINAL AND TRANSVAGINAL ULTRASOUND OF PELVIS Technique:  Both transabdominal and transvaginal ultrasound examinations of the pelvis were performed. Transabdominal technique was performed for global imaging of the pelvis including uterus, ovaries, adnexal regions, and pelvic cul-de-sac.  It was necessary to proceed with endovaginal exam following the transabdominal exam to visualize the endometrium and ovaries in better detail.  Comparison:  Obstetrical ultrasound dated 06/28/2006.  Findings:  Uterus: Normal in size and appearance  Endometrium: Minimal fluid within the fundal portion of the endometrial canal.  Otherwise, normal, measuring 2.6 in maximum thickness  transvaginally.  Right ovary:  Normal appearance/no adnexal mass  Left ovary: 2.0 x 2.0 x 1.6 cm cyst containing mildly thickened internal septations.  No internal blood flow with color Doppler within this cyst.  Otherwise, normal appearing left ovary.  Other findings: Trace amount of free peritoneal fluid, within normal limits of physiological fluid.  IMPRESSION:  1.  Tiny amount of fluid in the endometrial canal in the uterine fundus, possibly representing blood. 2.  2.0 cm mildly complex left ovarian cyst.  This most likely represents a cyst previously complicated by hemorrhage.  Original Report Authenticated By: Viviann Spare  Julianne Handler, M.D.   US Pelvis Complete  05/27/2012  *RADIOLOGY REPORT*  Clinical Data: Abnormal uterine bleeding.  TRANSABDOMINAL AND TRANSVAGINAL ULTRASOUND OF PELVIS Technique:  Both transabdominal and transvaginal ultrasound examinations of the pelvis were performed. Transabdominal technique was performed for global imaging of the pelvis including uterus, ovaries, adnexal regions, and pelvic cul-de-sac.  It was necessary to proceed with endovaginal exam following the transabdominal exam to visualize the endometrium and ovaries in better detail.  Comparison:  Obstetrical ultrasound dated 06/28/2006.  Findings:  Uterus: Normal in size and appearance  Endometrium: Minimal fluid within the fundal portion of the endometrial canal.  Otherwise, normal, measuring 2.6 in maximum thickness transvaginally.  Right ovary:  Normal appearance/no adnexal mass  Left ovary: 2.0 x 2.0 x 1.6 cm cyst containing mildly thickened internal septations.  No internal blood flow with color Doppler within this cyst.  Otherwise, normal appearing left ovary.  Other findings: Trace amount of free peritoneal fluid, within normal limits of physiological fluid.  IMPRESSION:  1.  Tiny amount of fluid in the endometrial canal in the uterine fundus, possibly representing blood. 2.  2.0 cm mildly complex left ovarian cyst.  This most  likely represents a cyst previously complicated by hemorrhage.  Original Report Authenticated By: Darrol Angel, M.D.   Assessment: 21 y.o. with abnormal vaginal bleeding   Trichomonas vaginosis   Ovarian cyst  Plan:  Rx Flagyl   Ibuprofen prn    Follow up with [facility]     I discussed in detail with the patient need for partner treatment of trichomonas at the same time as she is being treated and no sex for one week following treatment. Discussed with patient need for follow up with [facility] for removal of contraceptive implant that was placed 6 years ago and to discuss birth control method. Also to have pap smear and yearly physical. I discussed with the patient that her bleeding has not caused her to be anemic. Patient voices understanding.  ED Course  Procedures   MDM

## 2012-05-27 NOTE — MAU Note (Signed)
"  I had my last normal period from 5/27 - 6/1.  Then around the 6/17 it started again and hasn't stopped since.  It stopped one day last week for about 10 minutes and it started back again.  I passed blood clots.  I never pass blood clots with my period.  One day last week, it was a dark brown color and I thought it was going to stop, but it didn't.  The bleeding woke me up this morning.  It felt like I was wetting on myself.  I am so tired of bleeding.  I feel like a crazy person, because I keep having the bloating and cramps.  I know it's time to get this implant out.  The implant was inserted in 2008 at [facility].  I haven't been back to them since they put it in.  I get my paps done at [facility] on [facility].  My last pap was sometime earlier this year."

## 2012-05-27 NOTE — MAU Note (Signed)
Pt reports she has has an birth control implant in her arm that was placed in 2008. Has had irregular long periods  x3 months. This month she has had bleeding and clots x 2 weeks. This is the longest she has bled.  Not using anything else for birth control.

## 2012-05-28 LAB — GC/CHLAMYDIA PROBE AMP, GENITAL: GC Probe Amp, Genital: NEGATIVE

## 2012-06-05 NOTE — MAU Provider Note (Signed)
Attestation of Attending Supervision of Advanced Practitioner (CNM/NP): Evaluation and management procedures were performed by the Advanced Practitioner under my supervision and collaboration.  I have reviewed the Advanced Practitioner's note and chart, and I agree with the management and plan.  Flecia Shutter, M.D. 06/05/2012 8:31 AM  

## 2012-06-26 IMAGING — US US ABDOMEN COMPLETE
1 series · 14 of 25 positions shown · non-contrast
Comparison: None.

CLINICAL DATA: Right upper quadrant abdominal pain.

COMPLETE ABDOMINAL ULTRASOUND

[Series 1: us abdomen complete · 0.20mm/px · 14 of 69 slices shown]
[im 1/69]
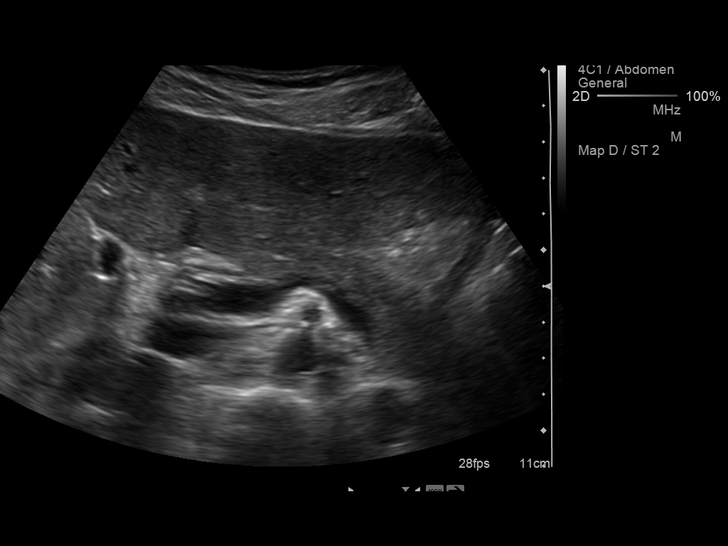
[im 6/69]
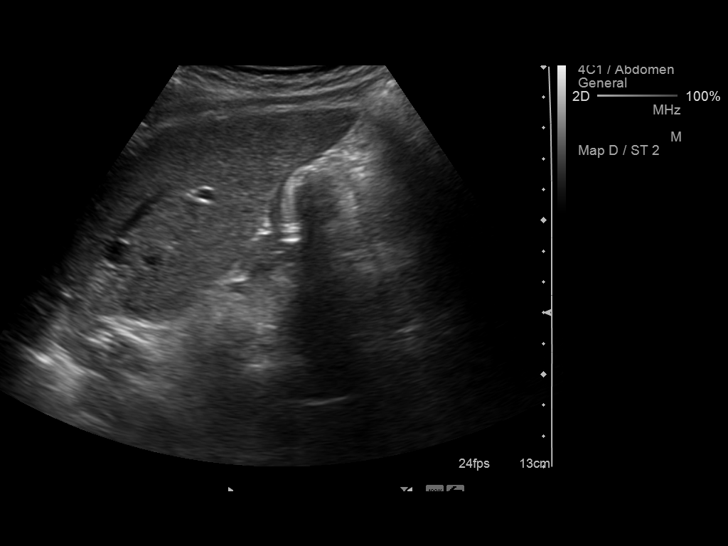
[im 12/69]
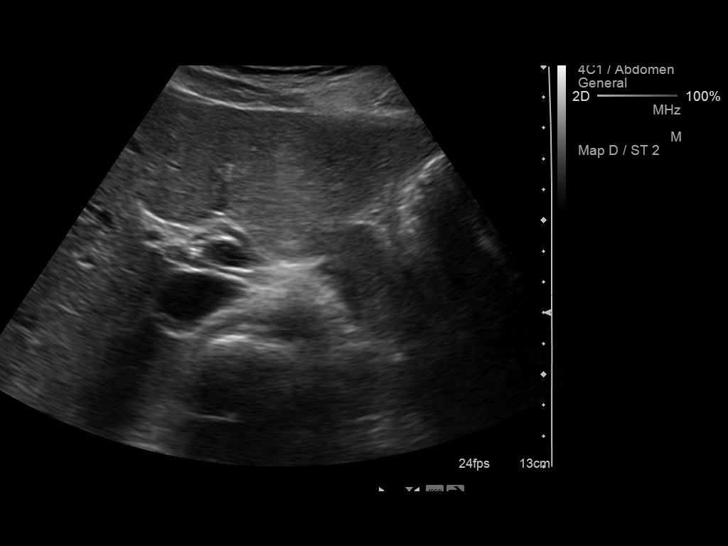
[im 18/69]
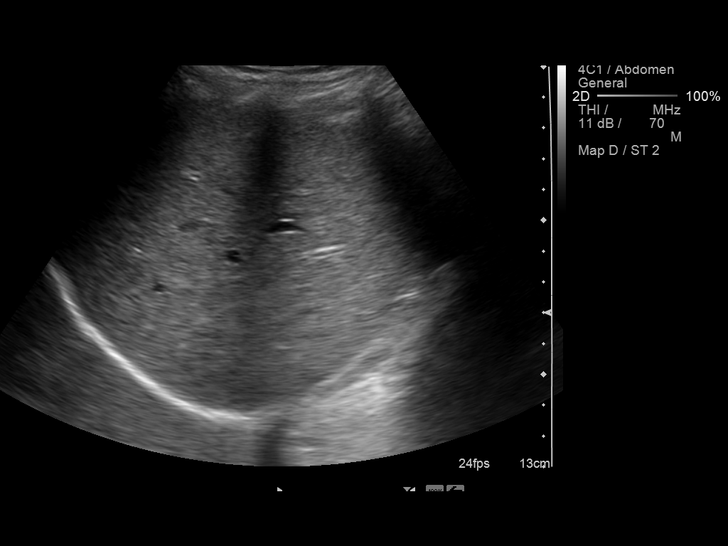
[im 23/69]
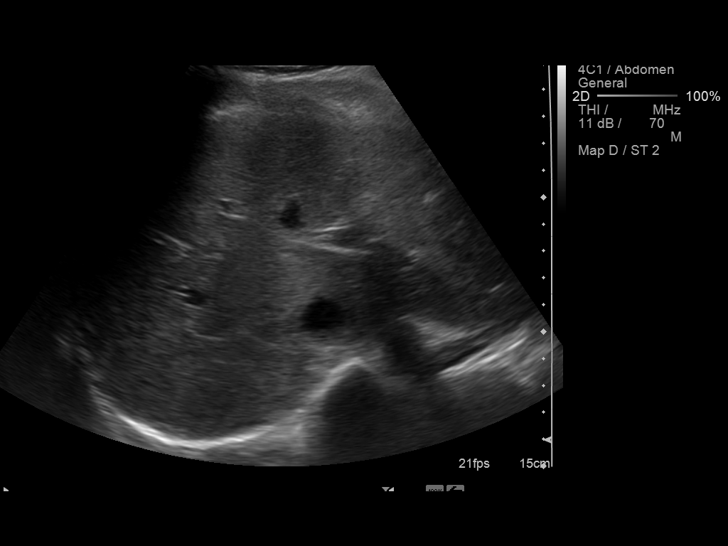
[im 26/69]
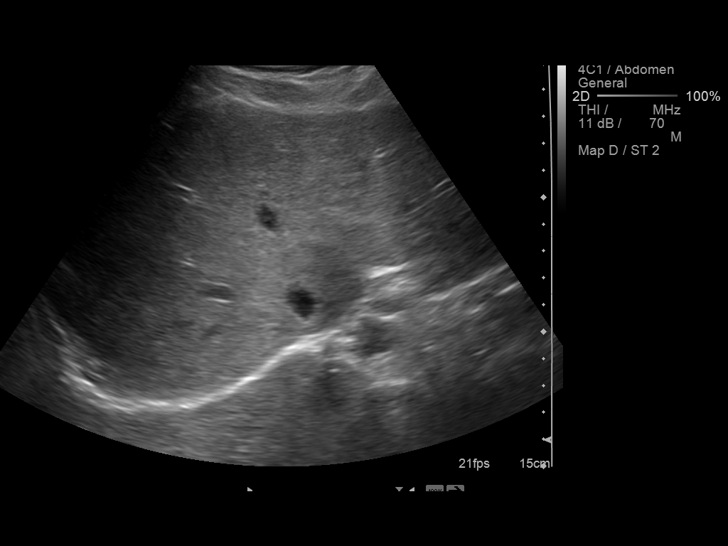
[im 32/69]
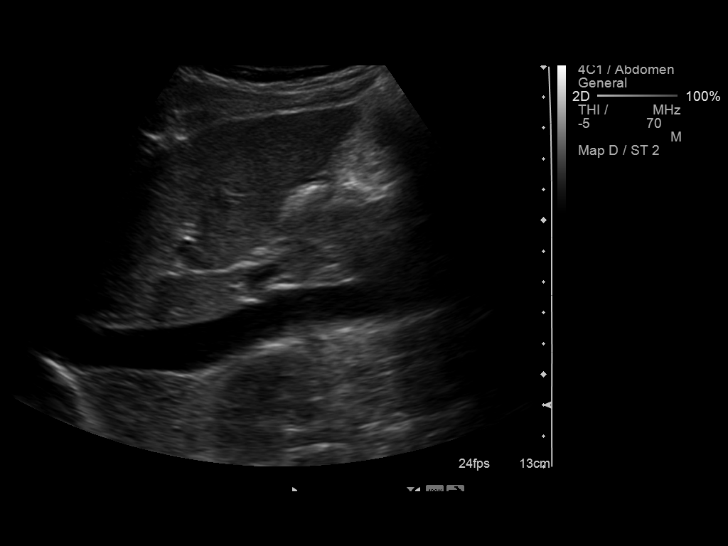
[im 37/69]
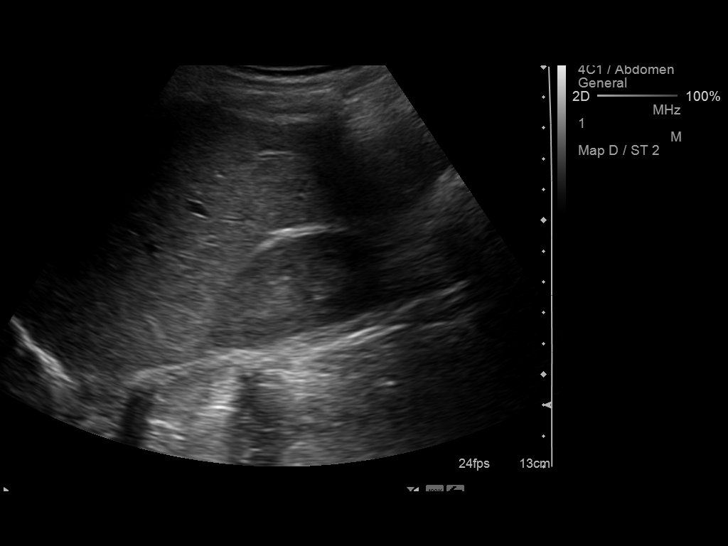
[im 43/69]
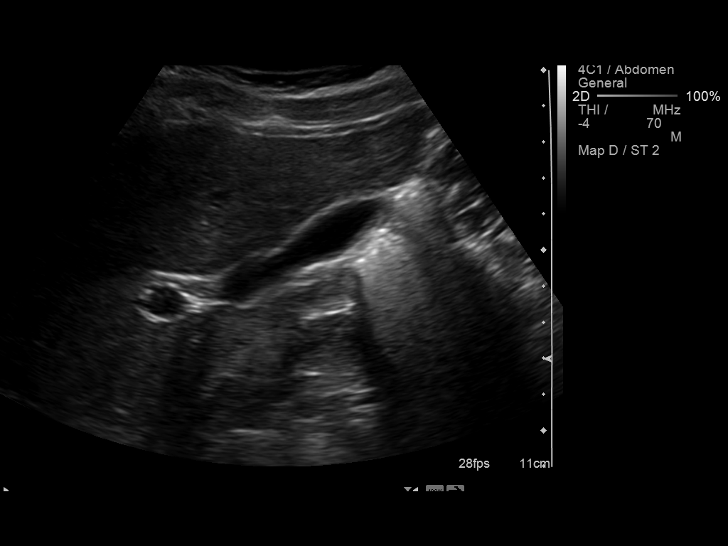
[im 46/69]
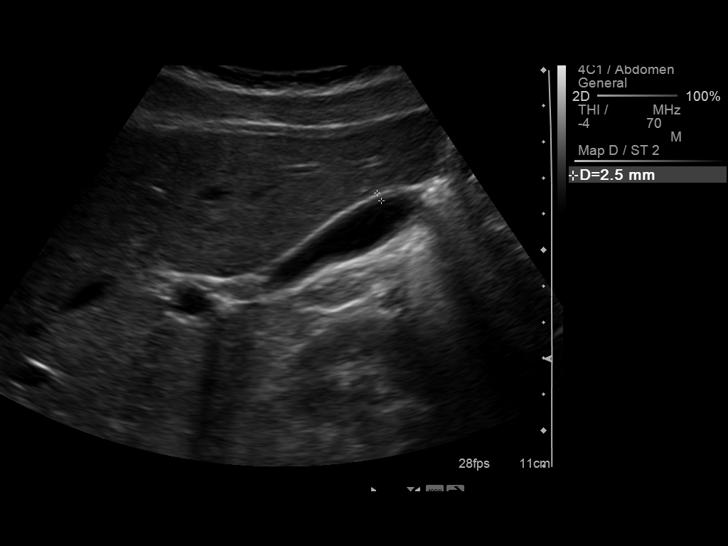
[im 52/69]
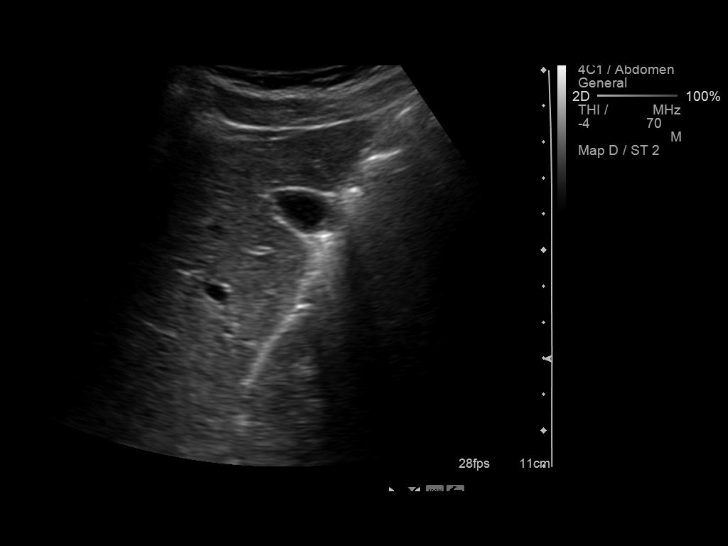
[im 57/69]
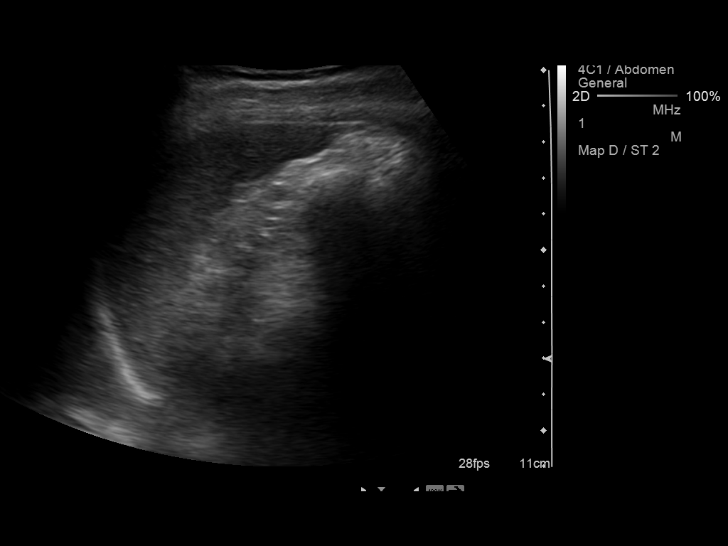
[im 63/69]
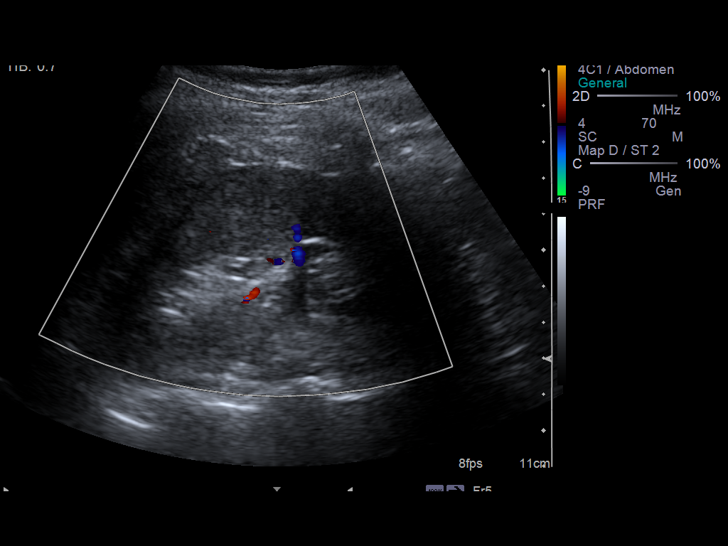
[im 69/69]
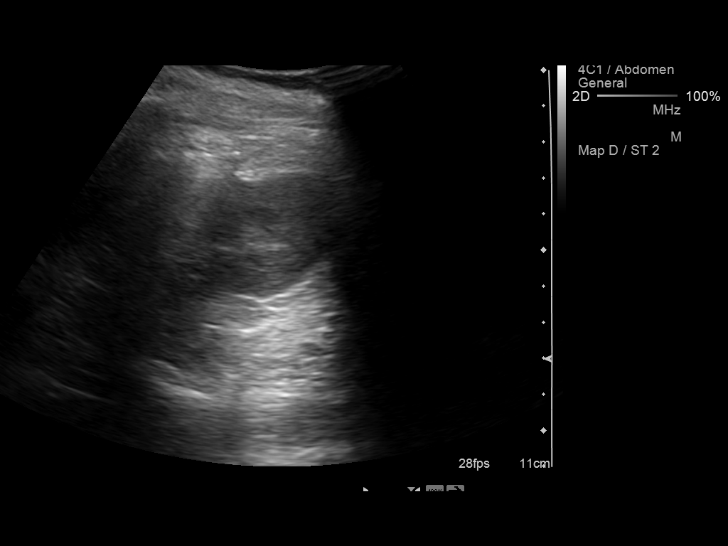

[14 of 25 positions shown; findings below may reference images not displayed]

FINDINGS: Gallbladder:  No gallstones, gallbladder wall thickening, or
pericholecystic fluid.

Common bile duct:  Not dilated, measuring 2.2 mm in diameter
proximally.

Liver:  No focal lesion identified.  Within normal limits in
parenchymal echogenicity.

IVC:  Appears normal.

Pancreas:  No focal abnormality seen.

Spleen:  Normal, measuring 5.1 cm in length.

Right Kidney:  Normal, measuring 9.1 cm in length.

Left Kidney:  Normal, measuring 9.3 cm in length.

Abdominal aorta:  Obscured mid and distally due to overlying bowel
gas.  No proximal aneurysm.
IMPRESSION: No acute abnormality.  Inadequately visualized abdominal aorta.

## 2012-07-26 ENCOUNTER — Emergency Department (HOSPITAL_COMMUNITY)
Admission: EM | Admit: 2012-07-26 | Discharge: 2012-07-26 | Disposition: A | Discharge status: Home / Self Care | Payer: Self-pay | Source: Home / Self Care | Attending: Emergency Medicine | Admitting: Emergency Medicine

## 2012-10-21 ENCOUNTER — Emergency Department (HOSPITAL_COMMUNITY)
Admission: EM | Admit: 2012-10-21 | Discharge: 2012-10-21 | Disposition: A | Discharge status: Home / Self Care | Payer: BC Managed Care – PPO | Attending: Emergency Medicine | Admitting: Emergency Medicine

## 2012-10-21 ENCOUNTER — Encounter (HOSPITAL_COMMUNITY): Payer: Self-pay | Admitting: *Deleted

## 2012-10-21 DIAGNOSIS — F17200 Nicotine dependence, unspecified, uncomplicated: Secondary | ICD-10-CM | POA: Insufficient documentation

## 2012-10-21 DIAGNOSIS — L97409 Non-pressure chronic ulcer of unspecified heel and midfoot with unspecified severity: Secondary | ICD-10-CM | POA: Insufficient documentation

## 2012-10-21 DIAGNOSIS — Z8659 Personal history of other mental and behavioral disorders: Secondary | ICD-10-CM | POA: Insufficient documentation

## 2012-10-21 DIAGNOSIS — L98499 Non-pressure chronic ulcer of skin of other sites with unspecified severity: Secondary | ICD-10-CM

## 2012-10-21 NOTE — ED Provider Notes (Signed)
History   This chart was scribed for Ethelda Chick, MD by Melba Coon, ED Scribe. The patient was seen in room TR11C/TR11C and the patient's care was started at 2:54PM.    CSN: [id number]  Arrival date & time 10/21/12  1339   None     Chief Complaint  Patient presents with  . Foot Pain    (Consider location/radiation/quality/duration/timing/severity/associated sxs/prior treatment) Patient is a 21 y.o. female presenting with lower extremity pain. The history is provided by the patient. No language interpreter was used.  Foot Pain This is a chronic problem. Episode onset: 2 years ago. The problem occurs constantly. The problem has been gradually worsening. The symptoms are aggravated by walking. Nothing relieves the symptoms.  She is complaining of pain on the bottom of her right foot. She has been told it is pain from a callous formation about 2 years ago. She reports that her callous has gotten progressively bigger since 2 years ago. She feels like there is something cutting into her foot and was worried it was a blood clot. Denies HA, fever, neck pain, sore throat, rash, back pain, CP, SOB, abdominal pain, nausea, emesis, diarrhea, dysuria, or extremity edema, weakness, numbness, or tingling. No known allergies. No other pertinent medical symptoms.  Past Medical History  Diagnosis Date  . Anxiety     Past Surgical History  Procedure Date  . Tonsillectomy   . Eye surgery     History reviewed. No pertinent family history.  History  Substance Use Topics  . Smoking status: Current Every Day Smoker  . Smokeless tobacco: Not on file  . Alcohol Use: Yes     Comment: occasionaly    OB History    Grav Para Term Preterm Abortions TAB SAB Ect Mult Living   1 1 1       1       Review of Systems 10 Systems reviewed and all are negative for acute change except as noted in the HPI.   Allergies  Review of patient's allergies indicates no known allergies.  Home Medications    Current Outpatient Rx  Name  Route  Sig  Dispense  Refill  . GUAIFENESIN 100 MG/5ML PO SOLN   Oral   Take 10 mLs by mouth every 4 (four) hours as needed. For cold symptoms           BP 110/86  Pulse 110  Temp 98.9 F (37.2 C) (Oral)  Resp 18  SpO2 98%  LMP 10/17/2012  Physical Exam  Nursing note and vitals reviewed. Constitutional:       Awake, alert, nontoxic appearance.  HENT:  Head: Atraumatic.  Eyes: Right eye exhibits no discharge. Left eye exhibits no discharge.  Neck: Neck supple.  Pulmonary/Chest: Effort normal. She exhibits no tenderness.  Abdominal: Soft. There is no tenderness. There is no rebound.  Musculoskeletal: She exhibits no tenderness.       Baseline ROM, no obvious new focal weakness.  Neurological:       Mental status and motor strength appears baseline for patient and situation.  Skin: No rash noted.       Dime-sized callous on plantar surface of right foot.  Psychiatric: She has a normal mood and affect.    ED Course  Procedures (including critical care time)  COORDINATION OF CARE:  2:58PM - Nothing will be ordered for [facility]. She is advised that it is not a blood clot but that she needs to see a foot specialist. Referral  will be given to her. She is ready for d/c.   Labs Reviewed - No data to display No results found.   1. Callous ulcer       MDM  Pt presenting with 2 years of callous on the plantar surface of her right foot.  She states she was told by a nail tech who was doing a pedicure that she needed to have it "checked out".  She states she didn't know of a foot doctor, so came to the ED.  There is no acute condition present.  I have given information to f/u with podiatry.  Pt is agreeable with this plan  I personally performed the services described in this documentation, which was scribed in my presence. The recorded information has been reviewed and is accurate.         Ethelda Chick, MD 10/21/12  [phone]

## 2012-10-21 NOTE — ED Notes (Signed)
Reports having pain to bottom of right foot for extended amount of time, has been told that its a callous but reports pain is getting worse

## 2013-01-19 ENCOUNTER — Emergency Department (HOSPITAL_COMMUNITY): Payer: BC Managed Care – PPO

## 2013-01-19 ENCOUNTER — Emergency Department (HOSPITAL_COMMUNITY)
Admission: EM | Admit: 2013-01-19 | Discharge: 2013-01-19 | Disposition: A | Discharge status: Home / Self Care | Payer: BC Managed Care – PPO | Attending: Emergency Medicine | Admitting: Emergency Medicine

## 2013-01-19 DIAGNOSIS — R110 Nausea: Secondary | ICD-10-CM

## 2013-01-19 DIAGNOSIS — Z3202 Encounter for pregnancy test, result negative: Secondary | ICD-10-CM | POA: Insufficient documentation

## 2013-01-19 DIAGNOSIS — F17200 Nicotine dependence, unspecified, uncomplicated: Secondary | ICD-10-CM | POA: Insufficient documentation

## 2013-01-19 DIAGNOSIS — R112 Nausea with vomiting, unspecified: Secondary | ICD-10-CM | POA: Insufficient documentation

## 2013-01-19 DIAGNOSIS — R1013 Epigastric pain: Secondary | ICD-10-CM | POA: Insufficient documentation

## 2013-01-19 DIAGNOSIS — R109 Unspecified abdominal pain: Secondary | ICD-10-CM

## 2013-01-19 DIAGNOSIS — Z8659 Personal history of other mental and behavioral disorders: Secondary | ICD-10-CM | POA: Insufficient documentation

## 2013-01-19 LAB — CBC WITH DIFFERENTIAL/PLATELET
Basophils Relative: 0 % (ref 0–1)
Eosinophils Absolute: 0.1 10*3/uL (ref 0.0–0.7)
HCT: 35.3 % — ABNORMAL LOW (ref 36.0–46.0)
Hemoglobin: 12 g/dL (ref 12.0–15.0)
Lymphs Abs: 0.8 10*3/uL (ref 0.7–4.0)
MCH: 25.2 pg — ABNORMAL LOW (ref 26.0–34.0)
MCHC: 34 g/dL (ref 30.0–36.0)
MCV: 74.2 fL — ABNORMAL LOW (ref 78.0–100.0)
Monocytes Absolute: 0.4 10*3/uL (ref 0.1–1.0)
Monocytes Relative: 4 % (ref 3–12)
RBC: 4.76 MIL/uL (ref 3.87–5.11)

## 2013-01-19 LAB — URINALYSIS, ROUTINE W REFLEX MICROSCOPIC
Bilirubin Urine: NEGATIVE
Glucose, UA: NEGATIVE mg/dL
Hgb urine dipstick: NEGATIVE
Ketones, ur: NEGATIVE mg/dL
Protein, ur: NEGATIVE mg/dL

## 2013-01-19 LAB — COMPREHENSIVE METABOLIC PANEL
Albumin: 3.7 g/dL (ref 3.5–5.2)
Alkaline Phosphatase: 50 U/L (ref 39–117)
BUN: 11 mg/dL (ref 6–23)
Chloride: 99 mEq/L (ref 96–112)
Creatinine, Ser: 0.73 mg/dL (ref 0.50–1.10)
GFR calc Af Amer: >90 mL/min (ref >90)
Glucose, Bld: 89 mg/dL (ref 70–99)
Total Bilirubin: 0.5 mg/dL (ref 0.3–1.2)
Total Protein: 7.3 g/dL (ref 6.0–8.3)

## 2013-01-19 LAB — LIPASE, BLOOD: Lipase: 31 U/L (ref 11–59)

## 2013-01-19 MED ORDER — HYDROCODONE-ACETAMINOPHEN 5-325 MG PO TABS: 2.0000 | ORAL_TABLET | ORAL | Status: DC | PRN

## 2013-01-19 MED ORDER — SODIUM CHLORIDE 0.9 % IV SOLN: Freq: Once | INTRAVENOUS | Status: DC

## 2013-01-19 MED ORDER — ONDANSETRON HCL 4 MG/2ML IJ SOLN
4.0000 mg | Freq: Once | INTRAMUSCULAR | Status: AC
  Administered 2013-01-19: 4 mg via INTRAVENOUS
  Filled 2013-01-19: qty 2

## 2013-01-19 MED ORDER — SODIUM CHLORIDE 0.9 % IV SOLN
INTRAVENOUS | Status: DC
  Administered 2013-01-19: 13:00:00 via INTRAVENOUS

## 2013-01-19 MED ORDER — ONDANSETRON 4 MG PO TBDP: 4.0000 mg | ORAL_TABLET | Freq: Three times a day (TID) | ORAL | Status: DC | PRN

## 2013-01-19 MED ORDER — HYDROMORPHONE HCL PF 1 MG/ML IJ SOLN
1.0000 mg | Freq: Once | INTRAMUSCULAR | Status: AC
  Administered 2013-01-19: 1 mg via INTRAVENOUS
  Filled 2013-01-19: qty 1

## 2013-01-19 NOTE — ED Provider Notes (Signed)
History     CSN: [id number]  Arrival date & time 01/19/13  1130   First MD Initiated Contact with Patient 01/19/13 1137      No chief complaint on file.   (Consider location/radiation/quality/duration/timing/severity/associated sxs/prior treatment) HPI Comments: Pt presents to the ED for severe abdominal pain since approx 4am.  Pain is described as sharp, non-radiating, and epigastric location.  Associated sx include nausea and vomiting.  No prior episodes of abdominal pain like this.  Denies any chest pain, SOB, dysuria, vaginal discharge, or new sexual partners.  Currently on her menstrual cycle.  Pt admits to EtOH consumption last night.  No prior episodes of kidney stones, pancreatitis, or gallbladder disease.  The history is provided by the patient and a significant other.    Past Medical History  Diagnosis Date  . Anxiety     Past Surgical History  Procedure Laterality Date  . Tonsillectomy    . Eye surgery      No family history on file.  History  Substance Use Topics  . Smoking status: Current Every Day Smoker  . Smokeless tobacco: Not on file  . Alcohol Use: Yes     Comment: occasionaly    OB History   Grav Para Term Preterm Abortions TAB SAB Ect Mult Living   1 1 1       1       Review of Systems  Gastrointestinal: Positive for nausea, vomiting and abdominal pain.  All other systems reviewed and are negative.    Allergies  Review of patient's allergies indicates no known allergies.  Home Medications   Current Outpatient Rx  Name  Route  Sig  Dispense  Refill  . ibuprofen (ADVIL,MOTRIN) 200 MG tablet   Oral   Take 400 mg by mouth every 6 (six) hours as needed for pain.           BP 133/82  Pulse 95  Temp(Src) 98.2 F (36.8 C) (Oral)  Resp 22  SpO2 100%  Physical Exam  Nursing note and vitals reviewed. Constitutional: She is oriented to person, place, and time. She appears well-developed and well-nourished.  Pt writhing and unable to  find comfortable position  HENT:  Head: Normocephalic and atraumatic.  Nose: Nose normal.  Mouth/Throat: Oropharynx is clear and moist. No oropharyngeal exudate.  Eyes: Conjunctivae and EOM are normal. Pupils are equal, round, and reactive to light.  Neck: Normal range of motion.  Cardiovascular: Normal rate, regular rhythm and normal heart sounds.   Pulmonary/Chest: Effort normal and breath sounds normal.  Abdominal: Soft. Normal appearance and bowel sounds are normal. There is tenderness in the epigastric area. There is no CVA tenderness, no tenderness at McBurney's point and negative Murphy's sign.  Lymphadenopathy:    She has no cervical adenopathy.  Neurological: She is alert and oriented to person, place, and time. She has normal strength.  CN grossly intact  Skin: Skin is warm and dry.  Psychiatric: She has a normal mood and affect.    ED Course  Procedures (including critical care time)  Labs Reviewed  URINALYSIS, ROUTINE W REFLEX MICROSCOPIC - Abnormal; Notable for the following:    APPearance CLOUDY (*)    All other components within normal limits  CBC WITH DIFFERENTIAL - Abnormal; Notable for the following:    HCT 35.3 (*)    MCV 74.2 (*)    MCH 25.2 (*)    Neutrophils Relative 87 (*)    Neutro Abs 8.5 (*)  Lymphocytes Relative 8 (*)    All other components within normal limits  COMPREHENSIVE METABOLIC PANEL - Abnormal; Notable for the following:    Sodium 134 (*)    All other components within normal limits  PREGNANCY, URINE  LIPASE, BLOOD   Dg Abd Acute W/chest  01/19/2013  *RADIOLOGY REPORT*  Clinical Data: Abdominal pain, vomiting.  ACUTE ABDOMEN SERIES (ABDOMEN 2 VIEW & CHEST 1 VIEW)  Comparison: Chest x-ray 12/24/2007  Findings: Heart and mediastinal contours are within normal limits. No focal opacities or effusions.  There is normal bowel gas pattern.  No free air.  No organomegaly or suspicious calcification.  No acute bony abnormality.  IMPRESSION: No  acute findings.   Original Report Authenticated By: Charlett Nose, M.D.      1. Abdominal pain   2. Nausea       MDM   22 y.o. Female presenting for epigastric abdominal pain x few hours.  Associated sx include nausea without vomiting or diarrhea.  Admits to EtOH consumption last night.  Labs and x-ray largely unremarkable as above.  Sx greatly improved with fluids, pain and nausea meds.  Pt declined pelvic exam or abd CT- states she only wants pain medications to keep her comfortable.  Given rx for vicodin and zofran to use PRN.  Return to the ED for new or worsening symptoms.       Garlon Hatchet, PA-C 01/19/13 1728

## 2013-01-19 NOTE — ED Notes (Signed)
Requesting graham crackers.

## 2013-01-19 NOTE — ED Notes (Signed)
Pt aware urine sample is needed, and has agreed to give sample when able to void.

## 2013-01-19 NOTE — ED Notes (Signed)
abd pain, vomiting started last night- son had the same last week.

## 2013-01-23 NOTE — ED Provider Notes (Signed)
Medical screening examination/treatment/procedure(s) were performed by non-physician practitioner and as supervising physician I was immediately available for consultation/collaboration.   Laray Anger, DO 01/23/13 [phone]

## 2014-07-26 ENCOUNTER — Emergency Department (HOSPITAL_COMMUNITY): Payer: BC Managed Care – PPO

## 2014-07-26 ENCOUNTER — Emergency Department (HOSPITAL_COMMUNITY)
Admission: EM | Admit: 2014-07-26 | Discharge: 2014-07-26 | Disposition: A | Discharge status: Home / Self Care | Payer: BC Managed Care – PPO | Attending: Emergency Medicine | Admitting: Emergency Medicine

## 2014-07-26 ENCOUNTER — Encounter (HOSPITAL_COMMUNITY): Payer: Self-pay | Admitting: Emergency Medicine

## 2014-07-26 DIAGNOSIS — F17200 Nicotine dependence, unspecified, uncomplicated: Secondary | ICD-10-CM | POA: Diagnosis not present

## 2014-07-26 DIAGNOSIS — H539 Unspecified visual disturbance: Secondary | ICD-10-CM | POA: Insufficient documentation

## 2014-07-26 DIAGNOSIS — F411 Generalized anxiety disorder: Secondary | ICD-10-CM | POA: Insufficient documentation

## 2014-07-26 DIAGNOSIS — M25579 Pain in unspecified ankle and joints of unspecified foot: Secondary | ICD-10-CM | POA: Diagnosis present

## 2014-07-26 DIAGNOSIS — M25571 Pain in right ankle and joints of right foot: Secondary | ICD-10-CM

## 2014-07-26 LAB — CBC
HEMATOCRIT: 32.9 % — AB (ref 36.0–46.0)
Hemoglobin: 10.8 g/dL — ABNORMAL LOW (ref 12.0–15.0)
MCH: 24.7 pg — ABNORMAL LOW (ref 26.0–34.0)
MCHC: 32.8 g/dL (ref 30.0–36.0)
MCV: 75.3 fL — AB (ref 78.0–100.0)
Platelets: 276 10*3/uL (ref 150–400)
RBC: 4.37 MIL/uL (ref 3.87–5.11)
RDW: 13.9 % (ref 11.5–15.5)
WBC: 9.6 10*3/uL (ref 4.0–10.5)

## 2014-07-26 LAB — SEDIMENTATION RATE: Sed Rate: 11 mm/hr (ref 0–22)

## 2014-07-26 MED ORDER — OXYCODONE-ACETAMINOPHEN 5-325 MG PO TABS
1.0000 | ORAL_TABLET | Freq: Once | ORAL | Status: AC
  Administered 2014-07-26: 1 via ORAL
  Filled 2014-07-26: qty 1

## 2014-07-26 MED ORDER — TRAMADOL HCL 50 MG PO TABS: 50.0000 mg | ORAL_TABLET | Freq: Four times a day (QID) | ORAL | Status: DC | PRN

## 2014-07-26 NOTE — ED Notes (Signed)
MD and resident at bedside for ultrasound.

## 2014-07-26 NOTE — ED Provider Notes (Signed)
CSN: [id number]     Arrival date & time 07/26/14  4098 History   None    Chief Complaint  Patient presents with  . Ankle Pain     (Consider location/radiation/quality/duration/timing/severity/associated sxs/prior Treatment) HPI Comments: She reports being woken up by extreme right lateral ankle pain this morning.  Prior to that she was completely asymptomatic and denies any fevers, chills, or rashes.  She reports a remote history gonorrhea and Chlamydia; She is currently sexually active but states uses protection.  She denies any previous ankle fractures or recent trauma.  Denies any family history of gout.  Patient is a 23 y.o. female presenting with ankle pain. The history is provided by the patient.  Ankle Pain Location:  Ankle Time since incident:  3 hours Injury: no   Ankle location:  R ankle Pain details:    Quality:  Shooting   Radiates to:  R leg   Severity:  Severe   Onset quality:  Sudden   Duration:  3 hours   Timing:  Constant   Progression:  Unchanged Chronicity:  New Dislocation: no   Prior injury to area:  No Relieved by:  Nothing Worsened by:  Extension, flexion, bearing weight and rotation Ineffective treatments:  None tried Associated symptoms: no fever, no numbness and no swelling   Risk factors: no concern for non-accidental trauma, no frequent fractures and no recent illness     Past Medical History  Diagnosis Date  . Anxiety    Past Surgical History  Procedure Laterality Date  . Tonsillectomy    . Eye surgery     History reviewed. No pertinent family history. History  Substance Use Topics  . Smoking status: Current Every Day Smoker  . Smokeless tobacco: Not on file  . Alcohol Use: Yes     Comment: occasionaly   OB History   Grav Para Term Preterm Abortions TAB SAB Ect Mult Living   Review of Systems  Constitutional: Negative for fever, chills and activity change.  HENT: Negative.   Eyes: Positive for visual  disturbance.       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    Respiratory: Negative.   Cardiovascular: Negative.   Gastrointestinal: Positive for abdominal pain. Negative for nausea, vomiting and diarrhea.       Complains of chronic abdominal pain since her epidural and birth of her child  Genitourinary: Negative for dysuria, frequency, flank pain, vaginal bleeding, vaginal discharge, genital sores and vaginal pain.  Skin: Negative for rash.      Allergies  Review of patient's allergies indicates no known allergies.  Home Medications   Prior to Admission medications   Medication Sig Start Date End Date Taking? Authorizing Provider  ibuprofen (ADVIL,MOTRIN) 200 MG tablet Take 400 mg by mouth every 6 (six) hours as needed for pain.   Yes Historical Provider, MD  traMADol (ULTRAM) 50 MG tablet Take 1 tablet (50 mg total) by mouth every 6 (six) hours as needed. 07/26/14   Jamal Collin, MD   BP 104/67  Pulse 79  Temp(Src) 98.4 F (36.9 C) (Oral)  Resp 15  SpO2 97%  LMP 06/30/2014 Physical Exam  Constitutional: She is oriented to person, place, and time. She appears well-developed and well-nourished. She appears distressed.  HENT:  Mouth/Throat: Oropharynx is clear and moist.  Eyes: Conjunctivae and EOM are normal. Pupils are equal, round,  and reactive to light.  Cardiovascular: Normal rate, regular rhythm and normal heart sounds.   No murmur heard. Pulmonary/Chest: Effort normal and breath sounds normal. No respiratory distress.  Abdominal: Soft. There is no tenderness.  Musculoskeletal:  Right Ankle Inspection: Mild swelling around lateral malleoli; No visible ecchymosis, erythema; Callus 2cm on plantar surface of 3rd MTP joint  Palpation: Diffuse tenderness greatest anterior to lateral malleolus; No warmth  ROM: Full in plantarflexion, dorsiflexion, inversion, and eversion of the foot; flexion and extension of the toes  Strength: 4/5 - limited  by pain Sensation: intact Vascular: intact w/ dorsalis pedis & posterior tibialis pulses 2+  Stable lateral and medial ligaments; Negative Anterior drawer test   Neurological: She is alert and oriented to person, place, and time.  Skin: Skin is warm. No rash noted.    ED Course  Procedures (including critical care time) Labs Review Labs Reviewed  CBC - Abnormal; Notable for the following:    Hemoglobin 10.8 (*)    HCT 32.9 (*)    MCV 75.3 (*)    MCH 24.7 (*)    All other components within normal limits  SEDIMENTATION RATE    Imaging Review Dg Ankle Complete Right  07/26/2014   CLINICAL DATA:  Lateral right ankle pain, no trauma  EXAM: RIGHT ANKLE - COMPLETE 3+ VIEW  COMPARISON:  None.  FINDINGS: There is no evidence of fracture, dislocation, or joint effusion. There is no evidence of arthropathy or other focal bone abnormality. Soft tissues are unremarkable. Frontal projection positioning is suboptimal with apparent extension at the ankle joint.  IMPRESSION: Negative.   Electronically Signed   By: Christiana Pellant M.D.   On: 07/26/2014 10:19     EKG Interpretation None      MDM   Final diagnoses:  Right ankle pain   Acute onset of right lateral ankle pain with mild swelling around lateral malleolus.  X-rays negative. Minimal joint effusion noted on bedside ultrasound.  Possible tendinopathy versus tenosynovitis (history of gonorrhea & Chlamydia but denies any current symptoms) less likely septic joint.  WBC within normal limits; Sedimentation rate normal as well.  Offered attempted joint aspiration however patient declines at this time.  Patient's pain considerably improved after by mouth pain medication.  Patient given return precautions for signs of infection, and advised to return to ED tomorrow for reevaluation.  Patient fitted with ankle brace given crutches and Rx for Ultram 50 mg #15.     Jamal Collin, MD 07/26/14 [phone]

## 2014-07-26 NOTE — ED Notes (Signed)
Pt reports waking up this am with severe pain to right ankle, denies injury. Pt crying and very anxious at triage.

## 2014-07-26 NOTE — ED Provider Notes (Signed)
I saw and evaluated the patient, reviewed the resident's note and I agree with the findings and plan.   EKG Interpretation None       ULTRASOUND LIMITED SOFT TISSUE/ MUSCULOSKELETAL: Acute Ankle pain, L Indication: ankle pain Linear probe used to evaluate area of interest in two planes. Findings:  Mild soft tissue stranding, with no large effusion of the L ankle Performed by: Dr Rhunette Croft Images saved electronically  Pt comes in with acute L ankle pain. Pain woke her up from sleep in the AM, and she called 911 immediately, as the pain was severe. She has been in a monogamous relationship for 5 years, and has no STDs since her teens. Pt has no vaginal discharge, abd pain.  Pt has never had monoarticular pain. There is no trauma, so infectious etiology considered. Pt has very mild swelling of the lateral L ankle, with tenderness over the tendon and the joint space. No redness, no warmth. Pt on my exam, has good ROM over the ankle joint.  ? Early infection. But there is not enough effusion for [place] to get any aspirate. We discussed attempt at arthrocentesis vs. Watchful waiting, and she prefers the latter. Pt advised that i work in the ER tomorrow, and charge Lequita Halt has been informed that patient to come to the triage for a quick f/u exam tomorrow.     Derwood Kaplan, MD 07/26/14 [phone]

## 2014-07-26 NOTE — Discharge Instructions (Signed)
Your ankle pain could be due to tendon injury or possible infection. You should return to ED for evaluation if your ankle pain worsens and you are unable to bear weight or if you develop fevers or worsening ankle swelling/redness or warmth.   Ankle Pain Ankle pain is a common symptom. The bones, cartilage, tendons, and muscles of the ankle joint perform a lot of work each day. The ankle joint holds your body weight and allows you to move around. Ankle pain can occur on either side or back of 1 or both ankles. Ankle pain may be sharp and burning or dull and aching. There may be tenderness, stiffness, redness, or warmth around the ankle. The pain occurs more often when a person walks or puts pressure on the ankle. CAUSES  There are many reasons ankle pain can develop. It is important to work with your caregiver to identify the cause since many conditions can impact the bones, cartilage, muscles, and tendons. Causes for ankle pain include:  Injury, including a break (fracture), sprain, or strain often due to a fall, sports, or a high-impact activity.  Swelling (inflammation) of a tendon (tendonitis).  Achilles tendon rupture.  Ankle instability after repeated sprains and strains.  Poor foot alignment.  Pressure on a nerve (tarsal tunnel syndrome).  Arthritis in the ankle or the lining of the ankle.  Crystal formation in the ankle (gout or pseudogout). DIAGNOSIS  A diagnosis is based on your medical history, your symptoms, results of your physical exam, and results of diagnostic tests. Diagnostic tests may include X-ray exams or a computerized magnetic scan (magnetic resonance imaging, MRI). TREATMENT  Treatment will depend on the cause of your ankle pain and may include:  Keeping pressure off the ankle and limiting activities.  Using crutches or other walking support (a cane or brace).  Using rest, ice, compression, and elevation.  Participating in physical therapy or home  exercises.  Wearing shoe inserts or special shoes.  Losing weight.  Taking medications to reduce pain or swelling or receiving an injection.  Undergoing surgery. HOME CARE INSTRUCTIONS   Only take over-the-counter or prescription medicines for pain, discomfort, or fever as directed by your caregiver.  Put ice on the injured area.  Put ice in a plastic bag.  Place a towel between your skin and the bag.  Leave the ice on for 15-20 minutes at a time, 03-04 times a day.  Keep your leg raised (elevated) when possible to lessen swelling.  Avoid activities that cause ankle pain.  Follow specific exercises as directed by your caregiver.  Record how often you have ankle pain, the location of the pain, and what it feels like. This information may be helpful to you and your caregiver.  Ask your caregiver about returning to work or sports and whether you should drive.  Follow up with your caregiver for further examination, therapy, or testing as directed. SEEK MEDICAL CARE IF:   Pain or swelling continues or worsens beyond 1 week.  You have an oral temperature above 102 F (38.9 C).  You are feeling unwell or have chills.  You are having an increasingly difficult time with walking.  You have loss of sensation or other new symptoms.  You have questions or concerns. MAKE SURE YOU:   Understand these instructions.  Will watch your condition.  Will get help right away if you are not doing well or get worse. Document Released: 04/27/2010 Document Revised: 01/30/2012 Document Reviewed: 04/27/2010 Exit[employer] Patient Information 2015 [employer]. This information is not intended to replace advice given to you by your health care provider. Make sure you discuss any questions you have with your health care provider.

## 2014-09-12 IMAGING — CR DG ANKLE COMPLETE 3+V*R*
3 series · 3 of 3 positions shown · non-contrast
Comparison: None.

CLINICAL DATA: Lateral right ankle pain, no trauma

EXAM:
RIGHT ANKLE - COMPLETE 3+ VIEW

[x ankle ap right]
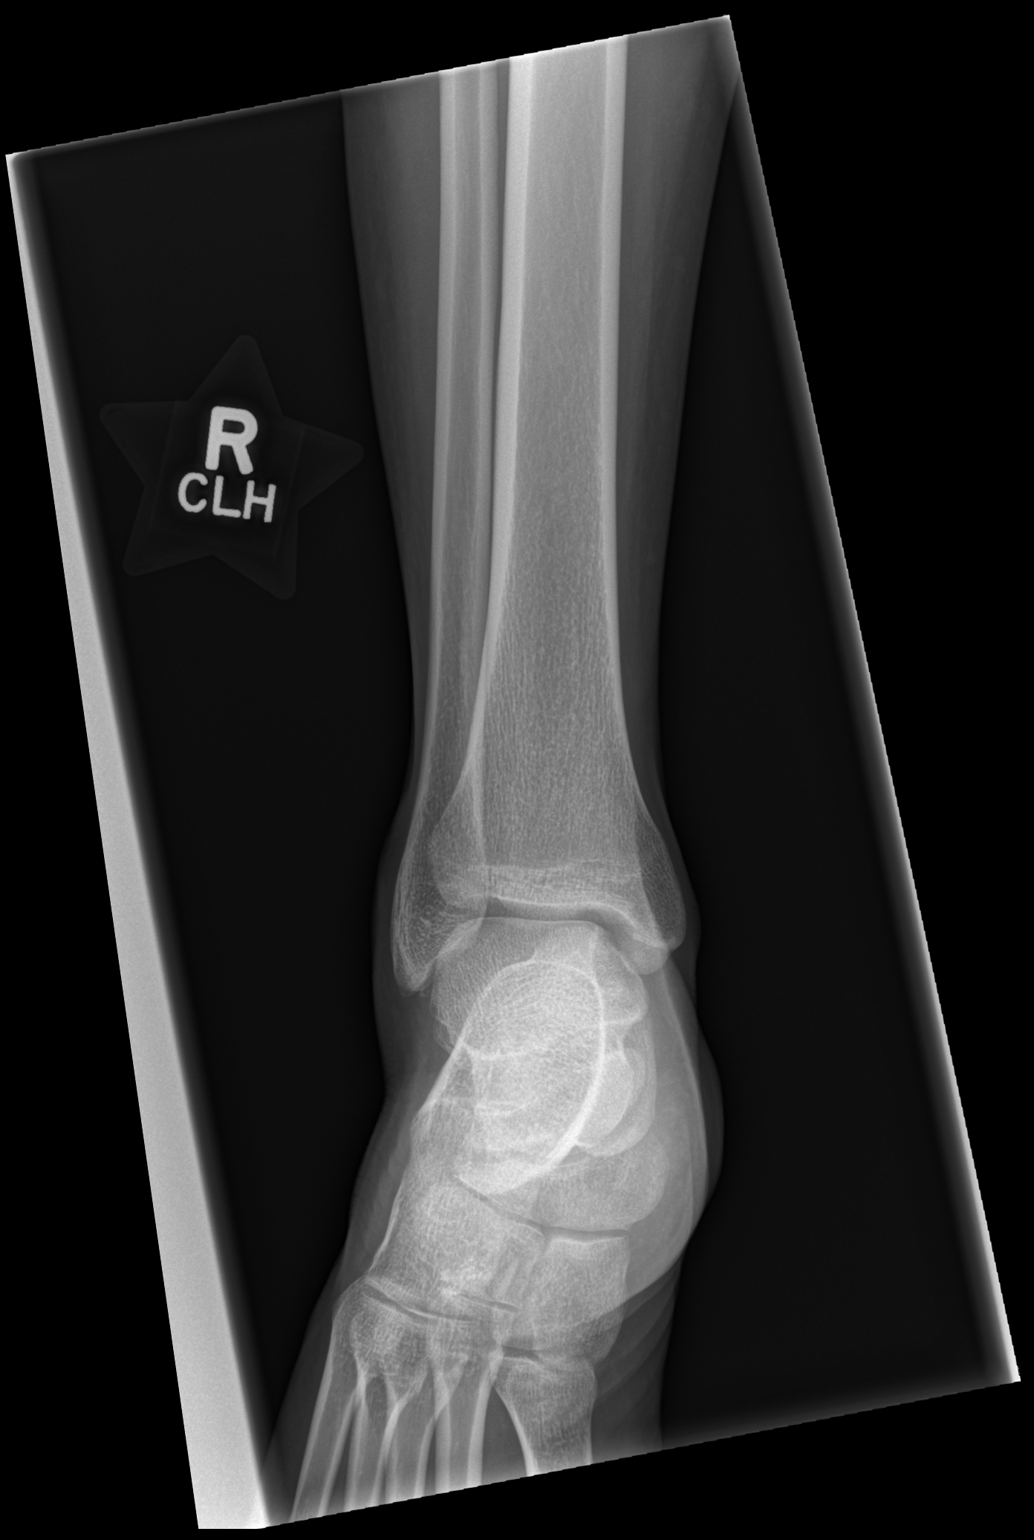

[x ankle obl right]
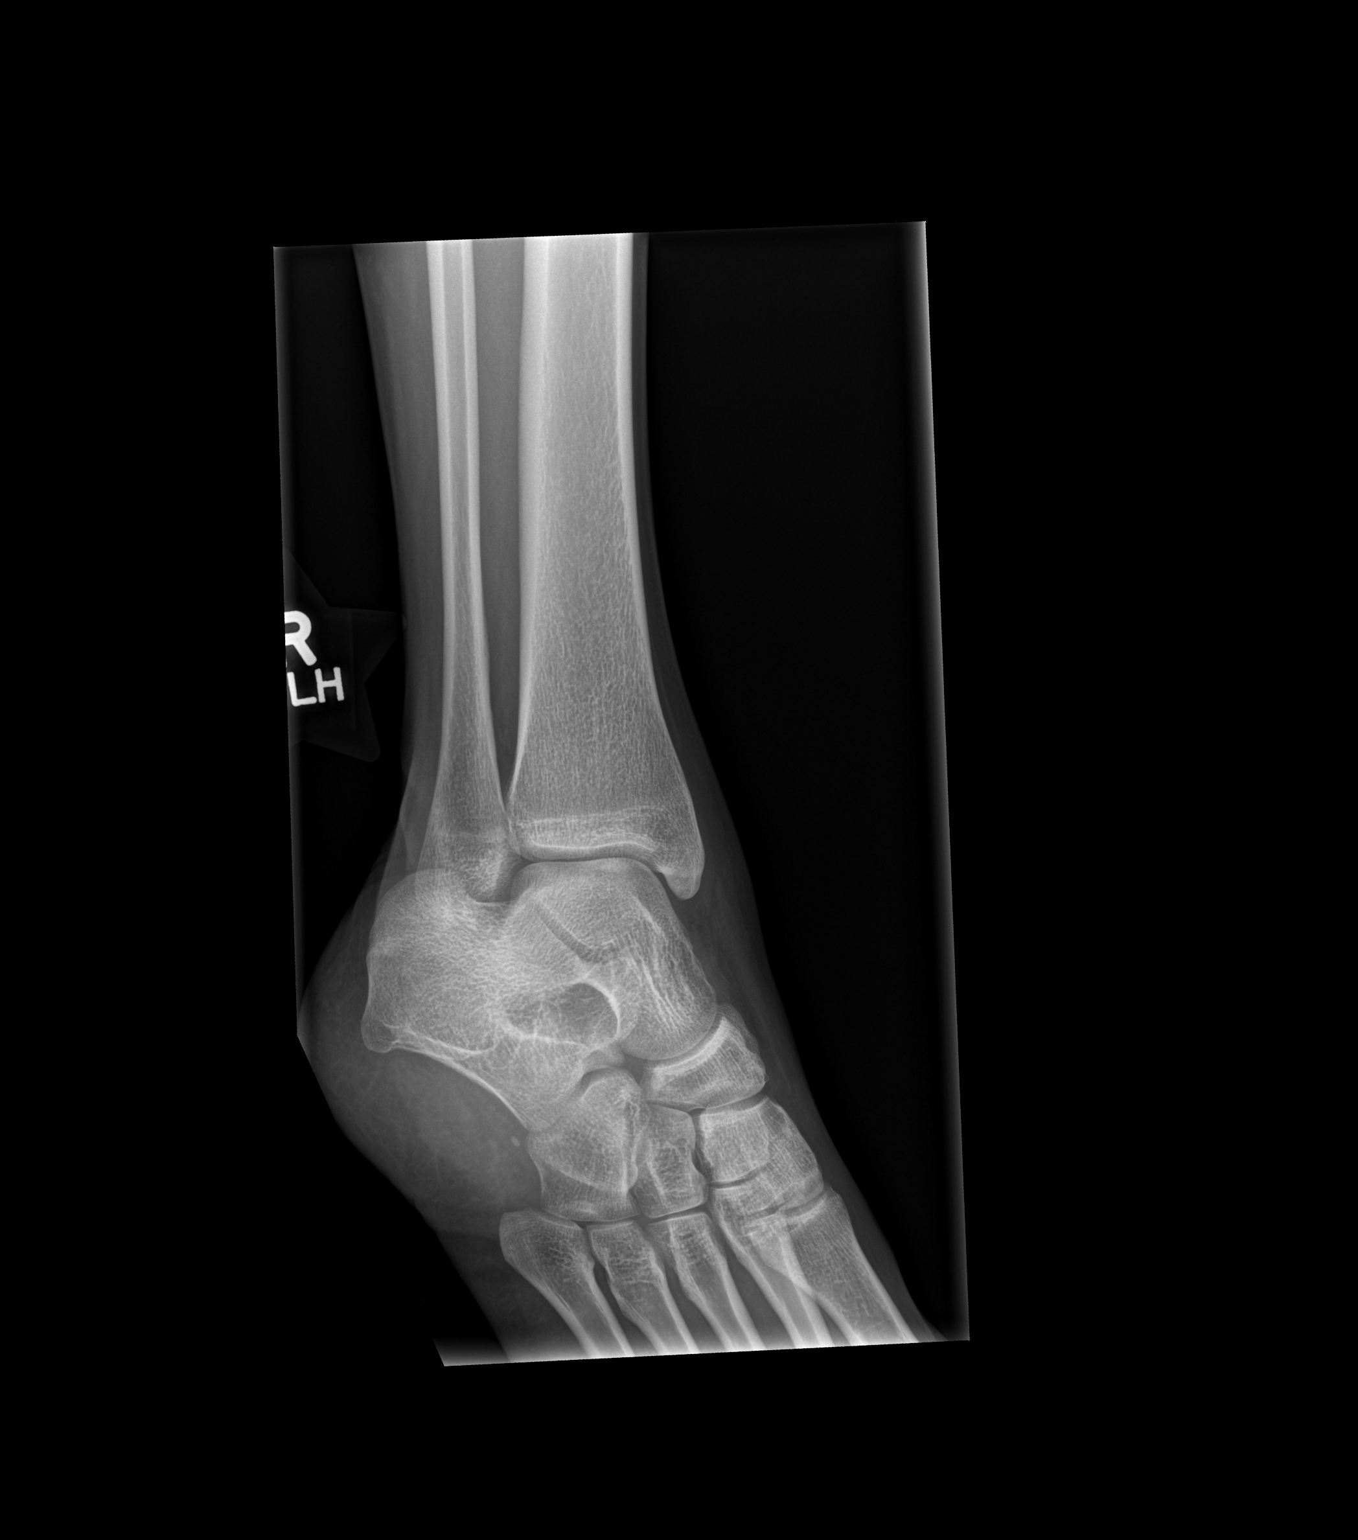

[x ankle lat right]
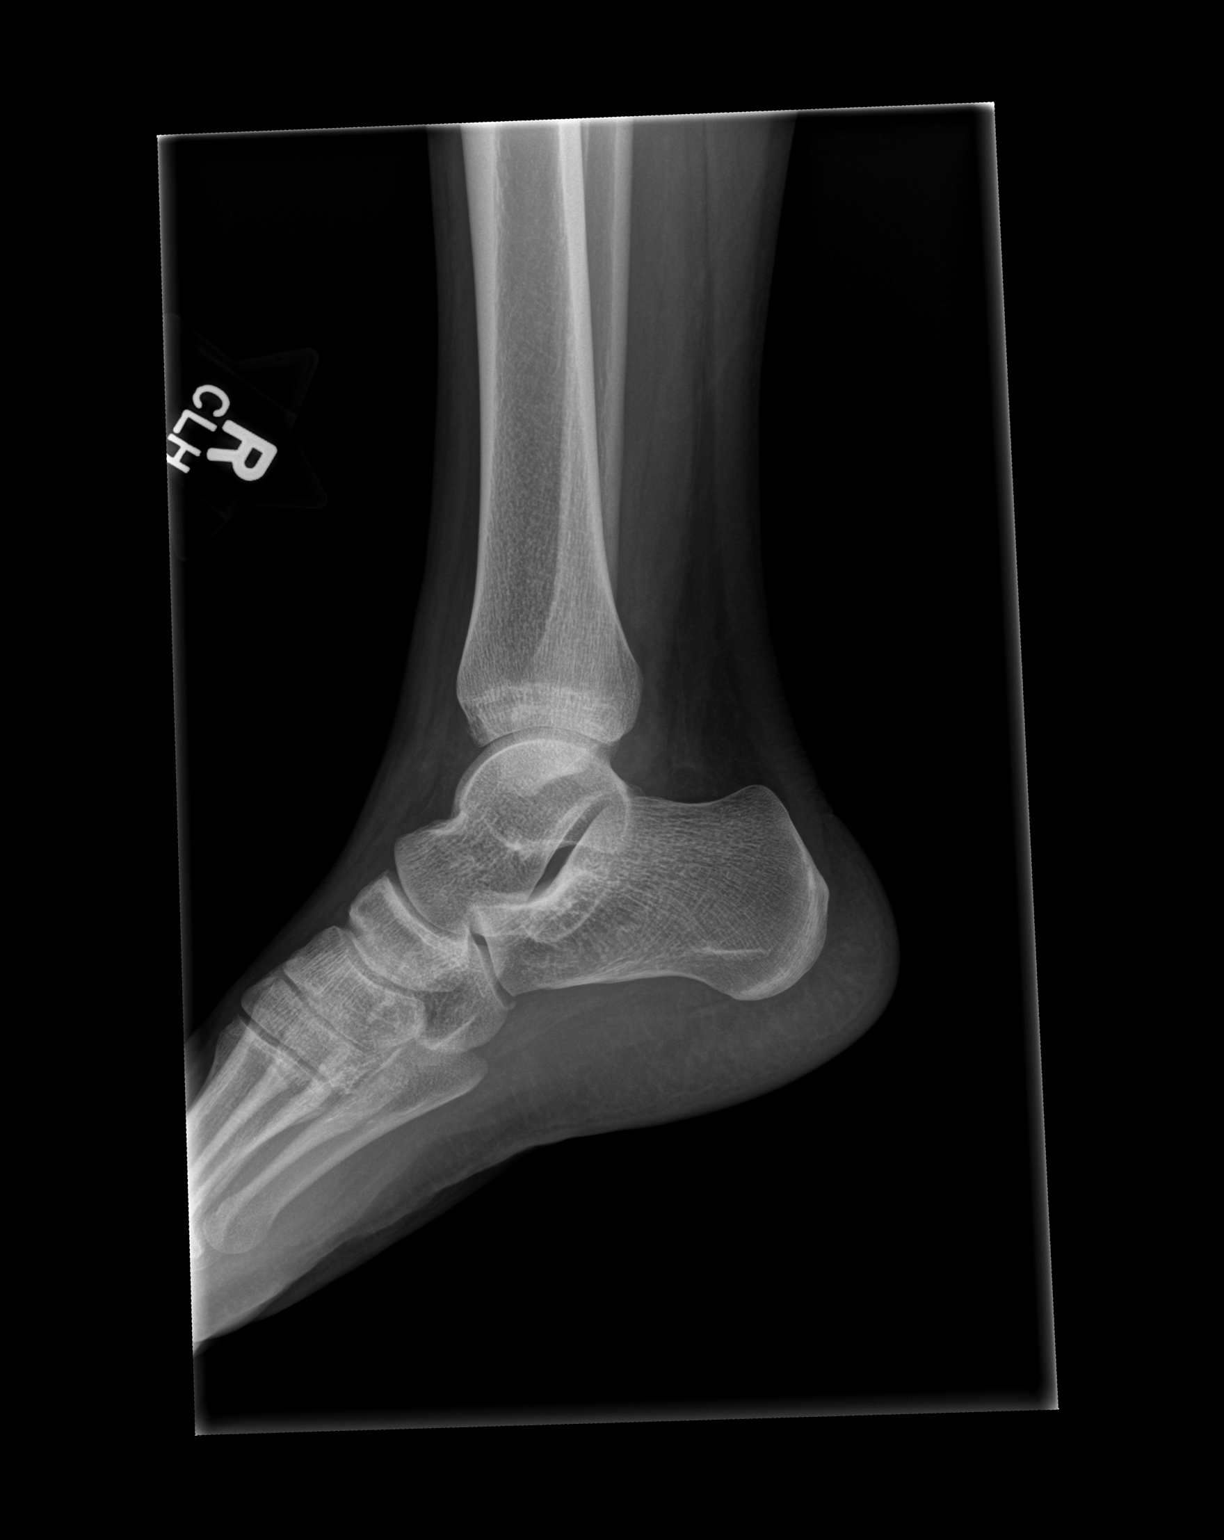

[3 of 3 positions shown; findings below may reference images not displayed]

FINDINGS: There is no evidence of fracture, dislocation, or joint effusion.
There is no evidence of arthropathy or other focal bone abnormality.
Soft tissues are unremarkable. Frontal projection positioning is
suboptimal with apparent extension at the ankle joint.
IMPRESSION: Negative.

## 2014-09-22 ENCOUNTER — Encounter (HOSPITAL_COMMUNITY): Payer: Self-pay | Admitting: Emergency Medicine

## 2015-01-09 ENCOUNTER — Encounter (HOSPITAL_COMMUNITY): Payer: Self-pay | Admitting: Emergency Medicine

## 2015-01-09 ENCOUNTER — Emergency Department (HOSPITAL_COMMUNITY)
Admission: EM | Admit: 2015-01-09 | Discharge: 2015-01-09 | Disposition: A | Discharge status: Home / Self Care | Payer: BC Managed Care – PPO | Attending: Emergency Medicine | Admitting: Emergency Medicine

## 2015-01-09 DIAGNOSIS — B001 Herpesviral vesicular dermatitis: Secondary | ICD-10-CM

## 2015-01-09 DIAGNOSIS — F419 Anxiety disorder, unspecified: Secondary | ICD-10-CM | POA: Diagnosis not present

## 2015-01-09 DIAGNOSIS — Z79899 Other long term (current) drug therapy: Secondary | ICD-10-CM | POA: Insufficient documentation

## 2015-01-09 DIAGNOSIS — Z720 Tobacco use: Secondary | ICD-10-CM | POA: Insufficient documentation

## 2015-01-09 MED ORDER — DOCOSANOL 10 % EX CREA: TOPICAL_CREAM | CUTANEOUS | Status: DC

## 2015-01-09 NOTE — ED Provider Notes (Signed)
CSN: [id number]     Arrival date & time 01/09/15  1106 History  This chart was scribed for Raymon MuttonMarissa Sahib Pella, PA-C, working with American Expressathan R. Rubin PayorPickering, MD by Chestine SporeSoijett Blue, ED Scribe. The patient was seen in room TR06C/TR06C at 11:20 AM.    Chief Complaint  Patient presents with  . Lillia Corporalcoldsore      The history is provided by the patient. No language interpreter was used.    HPI Comments: Nancy Williamson is a 24 y.o. female with a PMHx of anxiety who presents to the Emergency Department complaining of Cold Sore onset yesterday. She reports that yesterday her lip was initally irritated and she used a medicated chap stick and cool compresses with no relief. Reported that this morning she began to have a blister formation and redness. She reports that her immediate family is prone to getting cold sores. She reports that she has had cold sores before and she will treat with abreva cream, she didn't use this treatment this time because she ran out. Pt has had cough, congestion, and sore throat onset 2 weeks ago that has since resolved. She denies tongue swelling, SOB, difficulty breathing, chest pain, throat closing sensation, drainage from blister, oral sex, fever, warmth, dizziness, eye pain, new soaps/conditioners/makeup/foods. LMP currently menstruating.  PCP none   Past Medical History  Diagnosis Date  . Anxiety    Past Surgical History  Procedure Laterality Date  . Tonsillectomy    . Eye surgery     No family history on file. History  Substance Use Topics  . Smoking status: Current Every Day Smoker  . Smokeless tobacco: Not on file  . Alcohol Use: Yes     Comment: occasionaly   OB History    Gravida Para Term Preterm AB TAB SAB Ectopic Multiple Living   1 1 1       1      Review of Systems  Constitutional: Negative for fever.  HENT: Negative for congestion, sore throat and trouble swallowing.   Eyes: Negative for pain.  Respiratory: Negative for cough and shortness of breath.    Cardiovascular: Negative for chest pain.  Skin: Positive for color change (redness to the lower lip).  Neurological: Negative for dizziness, weakness and numbness.      Allergies  Review of patient's allergies indicates no known allergies.  Home Medications   Prior to Admission medications   Medication Sig Start Date End Date Taking? Authorizing Provider  Docosanol (ABREVA) 10 % CREA Apply to lip two times daily. 01/09/15   Richey Doolittle, PA-C  ibuprofen (ADVIL,MOTRIN) 200 MG tablet Take 400 mg by mouth every 6 (six) hours as needed for pain.    Historical Provider, MD  traMADol (ULTRAM) 50 MG tablet Take 1 tablet (50 mg total) by mouth every 6 (six) hours as needed. 07/26/14   Jamal CollinJames R Joyner, MD   BP 116/81 mmHg  Pulse 100  Temp(Src) 98.3 F (36.8 C) (Oral)  Resp 20  SpO2 100%  Physical Exam  Constitutional: She is oriented to person, place, and time. She appears well-developed and well-nourished. No distress.  HENT:  Head: Normocephalic and atraumatic.  Mouth/Throat: Oropharynx is clear and moist. No oropharyngeal exudate.    Mild swelling identified to the lower lip with pustular blister identified with clear fluid. Red halo surrounding the blister. Negative active drainage or bleeding noted. Negative cellulitic infection noted surrounding the blister. Negative tongue swelling  Eyes: Conjunctivae and EOM are normal. Pupils are equal, round, and reactive to  light. Right eye exhibits no discharge. Left eye exhibits no discharge.  Neck: Normal range of motion. Neck supple. No tracheal deviation present.  Cardiovascular: Normal rate, regular rhythm and normal heart sounds.  Exam reveals no friction rub.   No murmur heard. Pulmonary/Chest: Effort normal and breath sounds normal. No respiratory distress. She has no wheezes. She has no rales.  Patient is able to speak in full sentences without difficulty Negative use of accessory muscles Negative stridor  Musculoskeletal:  Normal range of motion.  Lymphadenopathy:    She has no cervical adenopathy.  Neurological: She is alert and oriented to person, place, and time. No cranial nerve deficit. She exhibits normal muscle tone. Coordination normal. GCS eye subscore is 4. GCS verbal subscore is 5. GCS motor subscore is 6.  Skin: Skin is warm and dry. No rash noted. She is not diaphoretic. No erythema.  Psychiatric: She has a normal mood and affect. Her behavior is normal. Thought content normal.  Nursing note and vitals reviewed.   ED Course  Procedures (including critical care time) DIAGNOSTIC STUDIES: Oxygen Saturation is 100% on room air, normal by my interpretation.    COORDINATION OF CARE: 11:30 AM-Discussed treatment plan which includes referral to allergist, f/u if the symptoms worsen.  Labs Review Labs Reviewed - No data to display  Imaging Review No results found.   EKG Interpretation None      MDM   Final diagnoses:  Cold sore    Medications - No data to display Filed Vitals:   01/09/15 1116  BP: 116/81  Pulse: 100  Temp: 98.3 F (36.8 C)  TempSrc: Oral  Resp: 20  SpO2: 100%   I personally performed the services described in this documentation, which was scribed in my presence. The recorded information has been reviewed and is accurate.   Patient presenting to the ED with a cold sore to her lower lip that started yesterday. Patient reported that she recently just got over an upper respiratory infection. 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 Reported that she ran out of Abreva. Denied oral sex. Doubt allergic reaction, anaphylactic reaction-negative signs respiratory distress, patient stable to speak in full sentences without difficulty. Negative signs of cellulitic infection. Patient stable, afebrile. Patient not septic appearing. Discharged patient. Discharged patient with Abreva.  [facility] and allergist-as per patient's request. Discussed with patient that the fluid in the blister is contagious. Discussed with patient to closely monitor symptoms and if symptoms are to worsen or change to report back to the ED - strict return instructions given.  Patient agreed to plan of care, understood, all questions answered.   Raymon Mutton, PA-C 01/09/15 1157  Juliet Rude. Rubin Payor, MD 01/13/15 [phone]

## 2015-01-09 NOTE — ED Notes (Signed)
Patient states had coldsore come up overnight.   Patient states hurts.   Patient has not used any OTC treatment at home.

## 2015-01-09 NOTE — Discharge Instructions (Signed)
Please call your doctor for a followup appointment within 24-48 hours. When you talk to your doctor please let them know that you were seen in the emergency department and have them acquire all of your records so that they can discuss the findings with you and formulate a treatment plan to fully care for your new and ongoing problems. Please rest and stay hydrated  Please use abreva as prescribed Please keep site covered for the fluid in the blister in contagious Please continue to monitor symptoms closely and if symptoms are to worsen or change (fever greater than 101, chills, sweating, nausea, vomiting, chest pain, shortness of breathe, difficulty breathing, weakness, numbness, tingling, worsening or changes to pain pattern, swelling to the lip, changes to skin color, numbness or tingling to the lip, tongue swelling, neck pain, neck stiffness) please report back to the Emergency Department immediately.    Cold Sore A cold sore (fever blister) is a skin infection caused by the herpes simplex virus (HSV-1). HSV-1 is closely related to the virus that causes genital herpes (HSV-2), but they are not the same even though both viruses can cause oral and genital infections. Cold sores are small, fluid-filled sores inside of the mouth or on the lips, gums, nose, chin, cheeks, or fingers.  The herpes simplex virus can be easily passed (contagious) to other people through close personal contact, such as kissing or sharing personal items. The virus can also spread to other parts of the body, such as the eyes or genitals. Cold sores are contagious until the sores crust over completely. They often heal within 2 weeks.  Once a person is infected, the herpes simplex virus remains permanently in the body. Therefore, there is no cure for cold sores, and they often recur when a person is tired, stressed, sick, or gets too much sun. Additional factors that can cause a recurrence include hormone changes in menstruation or  pregnancy, certain drugs, and cold weather.  CAUSES  Cold sores are caused by the herpes simplex virus. The virus is spread from person to person through close contact, such as through kissing, touching the affected area, or sharing personal items such as lip balm, razors, or eating utensils.  SYMPTOMS  The first infection may not cause symptoms. If symptoms develop, the symptoms often go through different stages. Here is how a cold sore develops:   Tingling, itching, or burning is felt 1-2 days before the outbreak.   Fluid-filled blisters appear on the lips, inside the mouth, nose, or on the cheeks.   The blisters start to ooze clear fluid.   The blisters dry up and a yellow crust appears in its place.   The crust falls off.  Symptoms depend on whether it is the initial outbreak or a recurrence. Some other symptoms with the first outbreak may include:   Fever.   Sore throat.   Headache.   Muscle aches.   Swollen neck glands.  DIAGNOSIS  A diagnosis is often made based on your symptoms and looking at the sores. Sometimes, a sore may be swabbed and then examined in the lab to make a final diagnosis. If the sores are not present, blood tests can find the herpes simplex virus.  TREATMENT  There is no cure for cold sores and no vaccine for the herpes simplex virus. Within 2 weeks, most cold sores go away on their own without treatment. Medicines cannot make the infection go away, but medicine can help relieve some of the pain associated with  the sores, can work to stop the virus from multiplying, and can also shorten healing time. Medicine may be in the form of creams, gels, pills, or a shot.  HOME CARE INSTRUCTIONS   Only take over-the-counter or prescription medicines for pain, discomfort, or fever as directed by your caregiver. Do not use aspirin.   Use a cotton-tip swab to apply creams or gels to your sores.   Do not touch the sores or pick the scabs. Wash your hands  often. Do not touch your eyes without washing your hands first.   Avoid kissing, oral sex, and sharing personal items until sores heal.   Apply an ice pack on your sores for 10-15 minutes to ease any discomfort.   Avoid hot, cold, or salty foods because they may hurt your mouth. Eat a soft, bland diet to avoid irritating the sores. Use a straw to drink if you have pain when drinking out of a glass.   Keep sores clean and dry to prevent an infection of other tissues.   Avoid the sun and limit stress if these things trigger outbreaks. If sun causes cold sores, apply sunscreen on the lips before being out in the sun.  SEEK MEDICAL CARE IF:   You have a fever or persistent symptoms for more than 2-3 days.   You have a fever and your symptoms suddenly get worse.   You have pus, not clear fluid, coming from the sores.   You have redness that is spreading.   You have pain or irritation in your eye.   You get sores on your genitals.   Your sores do not heal within 2 weeks.   You have a weakened immune system.   You have frequent recurrences of cold sores.  MAKE SURE YOU:   Understand these instructions.  Will watch your condition.  Will get help right away if you are not doing well or get worse. Document Released: 11/04/2000 Document Revised: 03/24/2014 Document Reviewed: 03/21/2012 [facility] Patient Information 2015 [place], [place]. This information is not intended to replace advice given to you by your health care provider. Make sure you discuss any questions you have with your health care provider.   Emergency Department Resource Guide 1) Find a Doctor and Pay Out of Pocket Although you won't have to find out who is covered by your insurance plan, it is a good idea to ask around and get recommendations. You will then need to call the office and see if the doctor you have chosen will accept you as a new patient and what types of options they offer for patients who  are self-pay. Some doctors offer discounts or will set up payment plans for their patients who do not have insurance, but you will need to ask so you aren't surprised when you get to your appointment.  2) Contact Your Local Health Department Not all health departments have doctors that can see patients for sick visits, but many do, so it is worth a call to see if yours does. If you don't know where your local health department is, you can check in your phone book. The CDC also has a tool to help you locate your state's health department, and many state websites also have listings of all of their local health departments.  3) Find a Walk-in Clinic If your illness is not likely to be very severe or complicated, you may want to try a walk in clinic. These are popping up all over the country in  pharmacies, drugstores, and shopping centers. They're usually staffed by nurse practitioners or physician assistants that have been trained to treat common illnesses and complaints. They're usually fairly quick and inexpensive. However, if you have serious medical issues or chronic medical problems, these are probably not your best option.  No Primary Care Doctor: - Call Health Connect at  [phone] - they can help you locate a primary care doctor that  accepts your insurance, provides certain services, etc. - Physician Referral Service- [phone]  Chronic Pain Problems: Organization         Address  Phone   Notes  [facility]  [phone] Patients need to be referred by their primary care doctor.   Medication Assistance: Organization         Address  Phone   Notes  [facility] Medication [facility] [address] [place] [place], [place] [address] [phone] --Must be a resident of [facility] -- Must have NO insurance coverage whatsoever (no Medicaid/ Medicare, etc.) -- The pt. MUST have a primary care doctor that directs their care regularly and follows  them in the community   [facility]  [phone]   [employer]  [phone]    Agencies that provide inexpensive medical care: Organization         Address  Phone   Notes  [facility]  [phone]   [facility]    [phone]   [facility] [address] [place], [place] [address] [phone]   [facility] [address] [place] [place] [phone]   Planned Parenthood    [phone]   [facility]    [phone]   [facility] [place] [facility]  [address], [place] Phone:  [phone], Fax:  [phone] Hours of Operation:  9 am - 6 pm, M-F.  Also accepts Medicaid/Medicare and self-pay.  [facility]  [address], [place] Phone: [phone], Fax: [phone]. Hours of Operation:  8:30 am - 5:30 pm, M-F.  Also accepts Medicaid and self-pay.  [facility] [place] [address] [place] Phone: [phone]   [facility] [address] Natasha Bence [place], [place] [phone], Ext. [phone] Mondays & Thursdays: 7-9 AM.  First 15 patients are seen on a first come, first serve basis.    Medicaid-accepting [facility] Providers:  Organization         Address  Phone   Notes  [facility] [address], [place] [phone] Also accepts self-pay patients.  [facility] [address] Laurell Josephs [place], [place]  [phone]   [facility] [address], [place] [phone]   [facility] [address] [place] [phone]   Renaye Rakers [address], [place]   [phone] Only accepts [place] Access [place] patients after they have their name applied to their card.   Self-Pay (no insurance) in [facility]:  Organization         Address  Phone   Notes  Sickle Cell  Patients, [facility] [address] [place], [place] [phone]   [facility] [address] [place], [place] [phone]   [facility] [place]  [address],  [place] [phone]   [facility]/Dr. Osei-Bonsu  [address] [place] or [address], [place] [phone] Phone number for both [place] and [place] locations is the same.  [facility] and [facility] [address] [place] [phone]   [facility] [address], [place] or [address] [phone]   [facility] [address] [place] [phone], phone; [phone], fax Sees patients 1st and 3rd Saturday of every month.  Must not qualify for public or private insurance (i.e. Medicaid, Medicare, [place] Health Choice, Veterans' Benefits)  Household income should be no more than 200% of the poverty level The clinic cannot treat you if you are pregnant or think you are pregnant  Sexually transmitted diseases are not treated at the clinic.    Dental Care: Organization         Address  Phone  Notes  [facility] Department of [facility] [address] [place], [place] [phone] Accepts children up to age 19 who are enrolled in [place] or [place] Health Choice; pregnant women with a Medicaid card; and children who have applied for Medicaid or [place] Health Choice, but were declined, whose parents can pay a reduced fee at time of service.  [facility]  [address], [place] [phone] Accepts children up to age 35 who are enrolled in [place] or [place] Health Choice; pregnant women with a Medicaid card; and children who have applied for Medicaid or [place] Health Choice, but were declined, whose parents can pay a reduced fee at time of service.  [facility]  [address] [place], [place] [phone] Patients are seen by appointment only. Walk-ins are not accepted. [facility] will see patients 74 years of age and older. Monday - Tuesday (8am-5pm) Most Wednesdays (8:30-5pm) $30 per visit, cash only  [facility]  [address], [employer] [phone] Patients are seen by appointment only. Walk-ins are not accepted. [facility] will see patients 82 years of age and older. One Wednesday Evening (Monthly: Volunteer Based).  $30 per visit, cash only  [employer]  [phone] for adults; Children under age 5, call Graduate Pediatric Dentistry at [phone]. Children aged 21-14, please call [phone] to request a pediatric application.  Dental services are provided in all areas of dental care including fillings, crowns and bridges, complete and partial dentures, implants, gum treatment, root canals, and extractions. Preventive care is also provided. Treatment is provided to both adults and children. Patients are selected via a lottery and there is often a waiting list.   [facility] [address] [place]  [phone] [email or web address]   [facility] [address] [place], [place] [phone], Ext. [phone] Second and Fourth Thursday of each month, opens at 6:30 AM; Clinic ends at 9 AM.  Patients are seen on a first-come first-served basis, and a limited number are seen during each clinic.   [facility]  [address] Ether Griffins [place], [place] [phone]   Eligibility Requirements You must have lived in [place], [place], or [place] counties for at least the last three months.   You cannot be eligible for state or federal sponsored [place], including [employer], [place], or [employer].   You generally cannot be  eligible for healthcare insurance through your employer.    How to apply: Eligibility  screenings are held every Tuesday and Wednesday afternoon from 1:00 pm until 4:00 pm. You do not need an appointment for the interview!  [facility] [address] [place], [place] [phone]   [facility] Health Department  [phone]   [facility] Health Department  [phone]   [facility] Health Department  [phone]    Behavioral Health Resources in the Community: Intensive Outpatient Programs Organization         Address  Phone  Notes  [facility] [address], [place], [place] [phone]   [facility]patient [address] [place], [place] [phone]   [facility] [address] [place], [place]  [phone]   [facility] [address]  [place], [place] [phone] or [phone]   Substance Abuse Resources Organization         Address  Phone  Notes  [facility]  [phone]   [facility]  [phone]   [facility] [place]  [phone]   Floydene Flock  [phone]   Residential & Outpatient Substance Abuse Program  [phone]   Psychological Services Organization         Address  Phone  Notes  [facility]  [phone]   [facility]  [phone]   [facility] [address] [place] [phone] or [phone]    Mobile Crisis Teams Organization         Address  Phone  Notes  [facility], Mobile Crisis Care Unit  [phone]   [facility]  [address] [place], [place] [phone]   Doristine Locks [address] [place] [phone]    Self-Help/Support Groups Organization         Address  Phone             Notes  [facility] [place] - variety of support groups  336- I7437963 Call for more information  Narcotics Anonymous (NA), [facility] [address], [employer] [place]  2 meetings at  this location   Statistician         Address  Phone  Notes  [facility] [address],    [place]  [phone]   [facility]  [address] [place] [address], [place], [place] [phone]   [facility] [address] [place], [place] [phone] Admissions: 8am-3pm M-F  [facility] [address],    [place], [place] [phone]   [facility] [address] [place], [place], [place] [phone]   [facility] [address],  [place], [place] [phone]   [facility] - Intensive Outpatient [address] Laurell Josephs [address], [place], [place] [phone]   [facility] ([facility] [address] [place]  [place], [place] [phone] or [phone]   [facility] ([facility]) [address], [place], [place] [phone] Accepts Medicaid  [facility] [place] [address]  [place] [phone] Substance Abuse/Addiction Treatment   [facility] Organization         Address  Phone  Notes  [facility]  [phone]   Angie Fava, PhD [address] Ske[address] [place], [place]   [phone] or [phone]   [facility]   [address]  [place], [place] [phone]   [facility] [address], [place], [place] [phone]) [phone] Insurance/Medicaid/sponsorship through [employer]  [facility] [address]                                    [place], [place] [phone]) [phone] Therapy/tele-psych/case  [facility] [address]   [place], [place] [phone]) [phone]    Dr. Lolly MustacheArfeen  [phone]   [facility] [place]  [employer] [facility] 1) [address], [place] 2) [address], [place] 3)  [address], [place] [phone](336) [phone]  [phone]) [phone]   [facility] Child Abuse Hotline [phone]) [phone] or [phone](336)  [phone] (After Hours)

## 2015-03-11 ENCOUNTER — Emergency Department (HOSPITAL_COMMUNITY)
Admission: EM | Admit: 2015-03-11 | Discharge: 2015-03-11 | Disposition: A | Discharge status: Home / Self Care | Payer: BC Managed Care – PPO | Attending: Emergency Medicine | Admitting: Emergency Medicine

## 2015-03-11 ENCOUNTER — Encounter (HOSPITAL_COMMUNITY): Payer: Self-pay | Admitting: Emergency Medicine

## 2015-03-11 DIAGNOSIS — M25561 Pain in right knee: Secondary | ICD-10-CM | POA: Diagnosis not present

## 2015-03-11 DIAGNOSIS — M79651 Pain in right thigh: Secondary | ICD-10-CM | POA: Diagnosis not present

## 2015-03-11 DIAGNOSIS — Y999 Unspecified external cause status: Secondary | ICD-10-CM | POA: Diagnosis not present

## 2015-03-11 DIAGNOSIS — Z8659 Personal history of other mental and behavioral disorders: Secondary | ICD-10-CM | POA: Insufficient documentation

## 2015-03-11 DIAGNOSIS — Y929 Unspecified place or not applicable: Secondary | ICD-10-CM | POA: Insufficient documentation

## 2015-03-11 DIAGNOSIS — S8011XA Contusion of right lower leg, initial encounter: Secondary | ICD-10-CM | POA: Insufficient documentation

## 2015-03-11 DIAGNOSIS — M7661 Achilles tendinitis, right leg: Secondary | ICD-10-CM | POA: Insufficient documentation

## 2015-03-11 DIAGNOSIS — X58XXXA Exposure to other specified factors, initial encounter: Secondary | ICD-10-CM | POA: Diagnosis not present

## 2015-03-11 DIAGNOSIS — Z720 Tobacco use: Secondary | ICD-10-CM | POA: Diagnosis not present

## 2015-03-11 DIAGNOSIS — M79604 Pain in right leg: Secondary | ICD-10-CM | POA: Diagnosis present

## 2015-03-11 DIAGNOSIS — Y939 Activity, unspecified: Secondary | ICD-10-CM | POA: Insufficient documentation

## 2015-03-11 DIAGNOSIS — M79609 Pain in unspecified limb: Secondary | ICD-10-CM | POA: Diagnosis not present

## 2015-03-11 HISTORY — DX: Deep phlebothrombosis in pregnancy, unspecified trimester: O22.30

## 2015-03-11 MED ORDER — HYDROCODONE-ACETAMINOPHEN 5-325 MG PO TABS: 1.0000 | ORAL_TABLET | Freq: Three times a day (TID) | ORAL | Status: DC | PRN

## 2015-03-11 MED ORDER — OXYCODONE-ACETAMINOPHEN 5-325 MG PO TABS
1.0000 | ORAL_TABLET | Freq: Once | ORAL | Status: AC
  Administered 2015-03-11: 1 via ORAL
  Filled 2015-03-11: qty 1

## 2015-03-11 MED ORDER — IBUPROFEN 800 MG PO TABS: 800.0000 mg | ORAL_TABLET | Freq: Three times a day (TID) | ORAL | Status: DC

## 2015-03-11 NOTE — Progress Notes (Signed)
*  PRELIMINARY RESULTS* Vascular Ultrasound Right lower extremity venous duplex has been completed.  Preliminary findings: negative for DVT and baker's cyst.   Farrel DemarkJill Eunice, RDMS, RVT  03/11/2015, 3:49 PM

## 2015-03-11 NOTE — Discharge Instructions (Signed)
1. Medications: vicodin, ibuprofen, usual home medications 2. Treatment: rest, drink plenty of fluids, use brace and crutches 3. Follow Up: Please followup with your primary doctor in 3 days for discussion of your diagnoses and further evaluation after today's visit; if you do not have a primary care doctor use the resource guide provided to find one; Please return to the ER for worsening symptoms, swelling of the leg.     Achilles Tendinitis Achilles tendinitis is inflammation of the tough, cord-like band that attaches the lower muscles of your leg to your heel (Achilles tendon). It is usually caused by overusing the tendon and joint involved.  CAUSES Achilles tendinitis can happen because of:  A sudden increase in exercise or activity (such as running).  Doing the same exercises or activities (such as jumping) over and over.  Not warming up calf muscles before exercising.  Exercising in shoes that are worn out or not made for exercise.  Having arthritis or a bone growth on the back of the heel bone. This can rub against the tendon and hurt the tendon. SIGNS AND SYMPTOMS The most common symptoms are:  Pain in the back of the leg, just above the heel. The pain usually gets worse with exercise and better with rest.  Stiffness or soreness in the back of the leg, especially in the morning.  Swelling of the skin over the Achilles tendon.  Trouble standing on tiptoe. Sometimes, an Achilles tendon tears (ruptures). Symptoms of an Achilles tendon rupture can include:  Sudden, severe pain in the back of the leg.  Trouble putting weight on the foot or walking normally. DIAGNOSIS Achilles tendinitis will be diagnosed based on symptoms and a physical examination. An X-ray may be done to check if another condition is causing your symptoms. An MRI may be ordered if your health care provider suspects you may have completely torn your tendon, which is called an Achilles tendon rupture.    TREATMENT  Achilles tendinitis usually gets better over time. It can take weeks to months to heal completely. Treatment focuses on treating the symptoms and helping the injury heal. HOME CARE INSTRUCTIONS   Rest your Achilles tendon and avoid activities that cause pain.  Apply ice to the injured area:  Put ice in a plastic bag.  Place a towel between your skin and the bag.  Leave the ice on for 20 minutes, 2-3 times a day  Try to avoid using the tendon (other than gentle range of motion) while the tendon is painful. Do not resume use until instructed by your health care provider. Then begin use gradually. Do not increase use to the point of pain. If pain does develop, decrease use and continue the above measures. Gradually increase activities that do not cause discomfort until you achieve normal use.  Do exercises to make your calf muscles stronger and more flexible. Your health care provider or physical therapist can recommend exercises for you to do.  Wrap your ankle with an elastic bandage or other wrap. This can help keep your tendon from moving too much. Your health care provider will show you how to wrap your ankle correctly.  Only take over-the-counter or prescription medicines for pain, discomfort, or fever as directed by your health care provider. SEEK MEDICAL CARE IF:   Your pain and swelling increase or pain is uncontrolled with medicines.  You develop new, unexplained symptoms or your symptoms get worse.  You are unable to move your toes or foot.  You develop  warmth and swelling in your foot.  You have an unexplained temperature. MAKE SURE YOU:   Understand these instructions.  Will watch your condition.  Will get help right away if you are not doing well or get worse. Document Released: 08/17/2005 Document Revised: 08/28/2013 Document Reviewed: 06/19/2013 [facility] Patient Information 2015 [place], [place]. This information is not intended to replace advice given  to you by your health care provider. Make sure you discuss any questions you have with your health care provider.      Emergency Department Resource Guide 1) Find a Doctor and Pay Out of Pocket Although you won't have to find out who is covered by your insurance plan, it is a good idea to ask around and get recommendations. You will then need to call the office and see if the doctor you have chosen will accept you as a new patient and what types of options they offer for patients who are self-pay. Some doctors offer discounts or will set up payment plans for their patients who do not have insurance, but you will need to ask so you aren't surprised when you get to your appointment.  2) Contact Your Local Health Department Not all health departments have doctors that can see patients for sick visits, but many do, so it is worth a call to see if yours does. If you don't know where your local health department is, you can check in your phone book. The CDC also has a tool to help you locate your state's health department, and many state websites also have listings of all of their local health departments.  3) Find a Walk-in Clinic If your illness is not likely to be very severe or complicated, you may want to try a walk in clinic. These are popping up all over the country in pharmacies, drugstores, and shopping centers. They're usually staffed by nurse practitioners or physician assistants that have been trained to treat common illnesses and complaints. They're usually fairly quick and inexpensive. However, if you have serious medical issues or chronic medical problems, these are probably not your best option.  No Primary Care Doctor: - Call Health Connect at  [phone] - they can help you locate a primary care doctor that  accepts your insurance, provides certain services, etc. - Physician Referral Service- [phone]  Chronic Pain Problems: Organization         Address  Phone   Notes  [facility]  [phone] Patients need to be referred by their primary care doctor.   Medication Assistance: Organization         Address  Phone   Notes  [facility] [address] [place], [place] [address] [phone] --Must be a resident of [facility] -- Must have NO insurance coverage whatsoever (no Medicaid/ Medicare, etc.) -- The pt. MUST have a primary care doctor that directs their care regularly and follows them in the community   [facility]  [phone]   [employer]  [phone]    Agencies that provide inexpensive medical care: Organization         Address  Phone   Notes  [facility]  [phone]   [facility]    [phone]   [facility] [address] [place], [place] [address] [phone]   [facility] [address] [place], [place] [phone]   Planned Parenthood    (  [phone]   [facility]    [phone]   [facility] and [facility]  [address], [place] Phone:  [phone], Fax:  [phone] Hours of Operation:  9 am - 6 pm, M-F.  Also accepts Medicaid/Medicare and self-pay.  [facility]  [address], [place] Phone: [phone], Fax: [phone]. Hours of Operation:  8:30 am - 5:30 pm, M-F.  Also accepts Medicaid and self-pay.  [facility] [place] [address], [place] Phone: [phone]   [facility] [address] Natasha Bence [place], [place] [phone], Ext. [phone] Mondays & Thursdays: 7-9 AM.  First 15 patients are seen on a first come, first serve basis.    Medicaid-accepting [facility] Providers:  Organization         Address  Phone   Notes  [facility] [address], [place] [phone] Also accepts self-pay patients.  [facility] [address] Laurell Josephs [place], [place]  [phone]   [facility] [address], [place] [phone]   [facility] [address] [place] [phone]   Renaye Rakers [address], [place]   [phone] Only accepts [place] patients after they have their name applied to their card.   Self-Pay (no insurance) in [facility]:  Organization         Address  Phone   Notes  Sickle Cell Patients, [facility] [address] [place], [place] [phone]   [facility] [address] [place], [place] [phone]   [facility]  [address], [place] [phone]   [facility]/Dr. Osei-Bonsu  [address], [place] or [address], [place] [phone] Phone number for both [place] and [place] locations is the same.  [facility] [address], [place] [phone]   [facility] [address], [place] or [address] [phone]   [facility] [address] [place] [phone], phone; [phone], fax Sees patients 1st and 3rd Saturday of every month.  Must not qualify for public or private insurance (i.e. Medicaid, Medicare, [place] Health Choice, Veterans' Benefits)  Household income should be no more than 200% of the poverty level The clinic cannot treat you if you are pregnant or think you are pregnant  Sexually transmitted diseases are not treated at the clinic.    Dental Care: Organization         Address  Phone  Notes  [facility] Department of [facility] [address] [place], [place] [phone] Accepts children up to age 39 who are enrolled in [place] or [place] Health Choice; pregnant women with a Medicaid card; and children who have applied for  Medicaid or [place] Health Choice, but were declined, whose parents can pay a reduced fee at time of service.  [facility] Department of [facility]  [address], [place] [phone] Accepts children up to age 78 who are enrolled in [place] or [place] Health Choice; pregnant women with a Medicaid card; and children who have applied for Medicaid or [place] Health Choice, but were declined, whose parents can pay a  reduced fee at time of service.  [facility]  [address] [place], [place] [phone] Patients are seen by appointment only. Walk-ins are not accepted. [facility] will see patients 52 years of age and older. Monday - Tuesday (8am-5pm) Most Wednesdays (8:30-5pm) $30 per visit, cash only  [facility] Adult Dental Access [facility]  [address], [facility] [phone] Patients are seen by appointment only. Walk-ins are not accepted. [facility] will see patients 19 years of age and older. One Wednesday Evening (Monthly: Volunteer Based).  $30 per visit, cash only  [employer]  [phone] for adults; Children under age 18, call Graduate Pediatric Dentistry at [phone]. Children aged 20-14, please call [phone] to request a pediatric application.  Dental services are provided in all areas of dental care including fillings, crowns and bridges, complete and partial dentures, implants, gum treatment, root canals, and extractions. Preventive care is also provided. Treatment is provided to both adults and children. Patients are selected via a lottery and there is often a waiting list.   [facility] [address], [place]  [phone] [email or web address]   [facility] [address] [place], [place] [phone], Ext. [phone] Second and Fourth Thursday of each month, opens at 6:30 AM; Clinic ends at 9 AM.  Patients are seen on a first-come first-served basis, and a limited number  are seen during each clinic.   [facility]  [address] Ether Griffins [place], [place] [phone]   Eligibility Requirements You must have lived in [place], [place], or [place] counties for at least the last three months.   You cannot be eligible for state or federal sponsored [place], including [employer], [place], or [employer].   You generally cannot be eligible for healthcare insurance through your employer.    How to apply: Eligibility screenings are held every Tuesday and Wednesday afternoon from 1:00 pm until 4:00 pm. You do not need an appointment for the interview!  [facility] [address], [place], [place] [phone]   [facility]  [phone]   [facility]  [phone]   [facility]  [phone]    Behavioral Health Resources in the Community: Intensive Outpatient Programs Organization         Address  Phone  Notes  [facility] Services [address], [place], [place] [phone]   [facility] [address] [place], [place] [phone]   [facility] [address], [place], [place]  [phone]   [facility] Mental Health [address],  [place], [place] [phone] or [phone]   Substance Abuse Resources Organization         Address  Phone  Notes  [facility] Drug Services  [phone]   [facility]  [phone]   [facility] [place]  [phone]   Floydene Flock  [phone]   Residential & Outpatient Substance Abuse Program  [phone]   Psychological Services Organization         Address  Phone  Notes  [facility] Health  [phone]   [facility] Services  [phone]   [facility] [address] [place] [phone] or [phone]    Mobile Crisis Teams Organization         Address  Phone  Notes  Therapeutic  Alternatives, Mobile Crisis Care Unit  [phone]  [facility]  [address] [place], [place] [phone]   [facility] [address] [place] [phone]    Self-Help/Support Groups Organization         Address  Phone             Notes  [facility] of [place] - variety of support groups  336- [phone] Call for more information  Narcotics Anonymous (NA), [facility] [address], [place]  2 meetings at this location   Statisticianesidential Treatment Programs Organization         Address  Phone  Notes  [facility] [address],    [place]  [phone]   [facility]  [address], [place] [phone], [place], [place] [phone]   [facility] [address], [place] [phone] Admissions: 8am-3pm M-F  [facility] [address]    [place], [place] [phone]   [facility] [address], [place], [place] [phone]   [facility] [address]  [place], [place] [phone]   [facility] - Intensive Outpatient [address], [place], [place] [phone]   [facility] ([facility] [address]  [place], [place] [phone] or [phone]   [facility] ([facility]) [address] [place], [place] [phone] Accepts Medicaid  [facility] [address]  [place] [phone] Substance Abuse/Addiction Treatment   [facility] Behavioral Health Resources Organization         Address  Phone  Notes  [facility]  [phone]) [phone]   Angie FavaJulie Brannon, PhD [address] [place], [place]   [phone] or [phone](336) [phone]   [facility]   [address] [place], [place] [phone]   [facility] [address], [place], [place] [phone] Insurance/Medicaid/sponsorship through [facility]  [facility] Families  [address]                                    [place], [place] [phone] Therapy/tele-psych/case  [facility] [address]   [place], [place] [phone]) [phone]    Dr. Lolly MustacheArfeen  [phone](336) [phone]   [facility] [place]  [employer] [facility] 1) [address], [place] 2) [address], [place] 3)  [address] [place] [address], [place] [phone]) [phone]  [phone]   [facility] Child Abuse Hotline [phone](336) [phone] or [phone](336) [phone] (After Hours)

## 2015-03-11 NOTE — ED Notes (Signed)
Pt in right lower leg-- pain over achilles tendon with palpation, pain radiates to calf area. Started 2 days ago, has not taken any OTC meds. Using crutches to not put pressure on foot, positive pedal pulse

## 2015-03-11 NOTE — ED Notes (Signed)
Pt at U/S

## 2015-03-11 NOTE — ED Notes (Signed)
Changed acuity per PA to do a DVT study.

## 2015-03-11 NOTE — ED Provider Notes (Signed)
CSN: [id number]     Arrival date & time 03/11/15  1315 History  This chart is scribed for non-physician practitioner, Nancy Forth, PA-C, working with Doug Sou, MD by Nancy Williamson, ED Scribe.  This patient was seen in room TR11C/TR11C and the patient's care was started 1:50 PM.        Chief Complaint  Patient presents with  . Leg Pain    Patient is a 24 y.o. female presenting with leg pain. The history is provided by the patient and medical records. No language interpreter was used.  Leg Pain Associated symptoms: no back pain, no fever and no neck pain    HPI Comments: Nancy Williamson is a 24 y.o. female with PMHx of DVT (during pregnancy 8 years ago - not anticoagulated) and anxiety who presents to the Emergency Department complaining of pain to posterior portion of right lower leg over achilles tendon with onset 2 days ago. She denies hearing a popping sound at onset. Pt states pain radiates to calf and notes associated bruising to the area. Pt using crutches due to pain with ambulation, unable to bear weight on foot. She has taken OTC medication for relief.  She reports does not play sports. Pt is not on bcp and denies recent surgeries or prolonged period of resting or travel. Pt denies known injury, numbness, and swelling.    Past Medical History  Diagnosis Date  . Anxiety   . DVT (deep vein thrombosis) in pregnancy 8 years ago-- right leg   Past Surgical History  Procedure Laterality Date  . Tonsillectomy    . Eye surgery     No family history on file. History  Substance Use Topics  . Smoking status: Current Every Day Smoker  . Smokeless tobacco: Not on file  . Alcohol Use: Yes     Comment: occasionaly   OB History    Gravida Para Term Preterm AB TAB SAB Ectopic Multiple Living   Review of Systems  Constitutional: Negative for fever, chills, diaphoresis, appetite change and unexpected weight change.  HENT: Negative for mouth sores.    Eyes: Negative for visual disturbance.  Respiratory: Negative for cough, chest tightness, shortness of breath and wheezing.   Cardiovascular: Negative for chest pain.  Gastrointestinal: Negative for nausea, vomiting, abdominal pain, diarrhea and constipation.  Endocrine: Negative for polydipsia, polyphagia and polyuria.  Genitourinary: Negative for dysuria, urgency, frequency and hematuria.  Musculoskeletal: Positive for myalgias and arthralgias. Negative for back pain, joint swelling, neck pain and neck stiffness.  Skin: Positive for color change (bruising). Negative for rash and wound.  Allergic/Immunologic: Negative for immunocompromised state.  Neurological: Negative for syncope, light-headedness, numbness and headaches.  Hematological: Does not bruise/bleed easily.  Psychiatric/Behavioral: Negative for sleep disturbance. The patient is not nervous/anxious.   All other systems reviewed and are negative.     Allergies  Review of patient's allergies indicates no known allergies.  Home Medications   Prior to Admission medications   Medication Sig Start Date End Date Taking? Authorizing Provider  Docosanol (ABREVA) 10 % CREA Apply to lip two times daily. Patient not taking: Reported on 03/11/2015 01/09/15   Marissa Sciacca, PA-C  HYDROcodone-acetaminophen (NORCO/VICODIN) 5-325 MG per tablet Take 1 tablet by mouth every 8 (eight) hours as needed for moderate pain or severe pain. 03/11/15   Selam Pietsch, PA-C  ibuprofen (ADVIL,MOTRIN) 800 MG tablet Take 1 tablet (800 mg total) by mouth 3 (  three) times daily. 03/11/15   Kydan Shanholtzer, PA-C  traMADol (ULTRAM) 50 MG tablet Take 1 tablet (50 mg total) by mouth every 6 (six) hours as needed. Patient not taking: Reported on 03/11/2015 07/26/14   Jamal Collin, MD   BP 94/53 mmHg  Pulse 76  Temp(Src) 97.8 F (36.6 C) (Oral)  Resp 18  Ht  (1.626 m)  Wt 121 lb (54.885 kg)  BMI 20.76 kg/m2  SpO2 100%  LMP  03/10/2015 Physical Exam  Constitutional: She appears well-developed and well-nourished. No distress.  HENT:  Head: Normocephalic and atraumatic.  Eyes: Conjunctivae are normal.  Neck: Normal range of motion.  Cardiovascular: Normal rate, regular rhythm, normal heart sounds and intact distal pulses.   No murmur heard. Capillary refill < 3 sec  Pulmonary/Chest: Effort normal and breath sounds normal.  Musculoskeletal: She exhibits tenderness. She exhibits no edema.  ROM: Full range of motion about his right foot, right knee and right hip; decreased range of motion of the right ankle due to pain Evidence of ecchymosis without swelling surrounding distal Achilles tendon Patient with pinpoint tenderness to the Achilles tendon No tenderness to the calf, anterior or posterior knee or thigh Patient with negative Thompson's test No edema of the foot or leg, no erythema or increased warmth   Neurological: She is alert. Coordination normal.  Sensation intact to dull and sharp curet the entirety of the right lower extremity Strength 3/5 in the right ankle due to poor effort and pain,5/5 in the toes, knee and hip  Skin: Skin is warm and dry. She is not diaphoretic. No erythema.  No tenting of the skin  Psychiatric: She has a normal mood and affect.  Nursing note and vitals reviewed.   ED Course  Procedures (including critical care time) DIAGNOSTIC STUDIES: Oxygen Saturation is 97% on room air, normal by my interpretation.    COORDINATION OF CARE: 1:58 PM Discussed treatment plan with patient at beside, the patient agrees with the plan and has no further questions at this time.   Labs Review Labs Reviewed - No data to display  Imaging Review No results found.   EKG Interpretation None      EMERGENCY DEPARTMENT US SOFT TISSUE INTERPRETATION "Study: Limited Ultrasound of the noted body part in comments below"  INDICATIONS: Pain Multiple views of the body part are obtained with a  multi-frequency linear probe  PERFORMED BY:  Myself  IMAGES ARCHIVED?: Yes  SIDE:Right   BODY PART:Other soft tisse (comment in note) achilles tendon  FINDINGS: Other  no achilles tendon rupture  LIMITATIONS:  Body Habitus  INTERPRETATION:  No abnormal findings noted  COMMENT:  No evidence of achilles tendon tear or rupture   Emergency Ultrasound Study:  Limited Duplex of right lower extremity veins  Performed by Nancy Forth, PA-C Indication: leg pain and/or swelling Visualization of saphenous-femoral junction, proximal femoral vein and popliteal vein regions in transverse plane with full compression visualized.  Interpretation no dvt visualized.  Images partially archived electronically due to difficulty with the saving function.  MDM   Final diagnoses:  Arthralgia of right lower leg  Right thigh pain  Achilles tendinitis of right lower extremity   Ndea Brownlow presents with pain to the right leg. Patient with distant history of DVT during pregnancy. No risk factors for DVT at this time. Patient denies trauma. Patient's pain is pinpoint to her Achilles tendon. Informal ultrasound without evidence of DVT or Achilles tendon rupture. Patient refuses to ambulate due to  pain. Likely Achilles tendinitis.  We'll give brace, pain control and crutches.  2:12 PM Patient now reports to RN that she is having pain throughout the entirety of her posterior right thigh.  She initially denied this to me and was very clear about the localization of pain to her right Achilles.  Informal bedside ultrasound showed no DVT in the calf.  We'll obtain formal DVT study.    4:24 PM No evidence of DVT on formal DVT study.  She'll be discharged home and treated for Achilles tendinitis.  BP 94/53 mmHg  Pulse 76  Temp(Src) 97.8 F (36.6 C) (Oral)  Resp 18  Ht 5\' 4"  (1.626 m)  Wt 121 lb (54.885 kg)  BMI 20.76 kg/m2  SpO2 100%  LMP 03/10/2015  I personally performed the services  described in this documentation, which was scribed in my presence. The recorded information has been reviewed and is accurate.   Dahlia ClientHannah Nyala Kirchner, PA-C 03/11/15 1624  Doug SouSam Jacubowitz, MD 03/11/15 [id number]

## 2015-09-25 ENCOUNTER — Encounter (HOSPITAL_COMMUNITY): Payer: Self-pay | Admitting: *Deleted

## 2015-09-25 ENCOUNTER — Inpatient Hospital Stay (HOSPITAL_COMMUNITY)
Admission: AD | Admit: 2015-09-25 | Discharge: 2015-09-25 | Disposition: A | Discharge status: Home / Self Care | Payer: Self-pay | Source: Ambulatory Visit | Attending: Obstetrics & Gynecology | Admitting: Obstetrics & Gynecology

## 2015-09-25 DIAGNOSIS — N898 Other specified noninflammatory disorders of vagina: Secondary | ICD-10-CM | POA: Insufficient documentation

## 2015-09-25 DIAGNOSIS — F17200 Nicotine dependence, unspecified, uncomplicated: Secondary | ICD-10-CM | POA: Insufficient documentation

## 2015-09-25 DIAGNOSIS — A5901 Trichomonal vulvovaginitis: Secondary | ICD-10-CM

## 2015-09-25 DIAGNOSIS — N764 Abscess of vulva: Secondary | ICD-10-CM | POA: Insufficient documentation

## 2015-09-25 DIAGNOSIS — A599 Trichomoniasis, unspecified: Secondary | ICD-10-CM | POA: Insufficient documentation

## 2015-09-25 LAB — URINALYSIS, ROUTINE W REFLEX MICROSCOPIC
BILIRUBIN URINE: NEGATIVE
GLUCOSE, UA: NEGATIVE mg/dL
Ketones, ur: NEGATIVE mg/dL
Nitrite: NEGATIVE
PROTEIN: NEGATIVE mg/dL
Specific Gravity, Urine: 1.02 (ref 1.005–1.030)
Urobilinogen, UA: 0.2 mg/dL (ref 0.0–1.0)
pH: 5.5 (ref 5.0–8.0)

## 2015-09-25 LAB — URINE MICROSCOPIC-ADD ON

## 2015-09-25 LAB — POCT PREGNANCY, URINE: Preg Test, Ur: NEGATIVE

## 2015-09-25 MED ORDER — METRONIDAZOLE 500 MG PO TABS
2000.0000 mg | ORAL_TABLET | Freq: Once | ORAL | Status: AC
  Administered 2015-09-25: 2000 mg via ORAL
  Filled 2015-09-25: qty 4

## 2015-09-25 NOTE — Discharge Instructions (Signed)
Sexually Transmitted Disease °A sexually transmitted disease (STD) is a disease or infection that may be passed (transmitted) from person to person, usually during sexual activity. This may happen by way of saliva, semen, blood, vaginal mucus, or urine. Common STDs include: °· Gonorrhea. °· Chlamydia. °· Syphilis. °· HIV and AIDS. °· Genital herpes. °· Hepatitis B and C. °· Trichomonas. °· Human papillomavirus (HPV). °· Pubic lice. °· Scabies. °· Mites. °· Bacterial vaginosis. °WHAT ARE CAUSES OF STDs? °An STD may be caused by bacteria, a virus, or parasites. STDs are often transmitted during sexual activity if one person is infected. However, they may also be transmitted through nonsexual means. STDs may be transmitted after:  °· Sexual intercourse with an infected person. °· Sharing sex toys with an infected person. °· Sharing needles with an infected person or using unclean piercing or tattoo needles. °· Having intimate contact with the genitals, mouth, or rectal areas of an infected person. °· Exposure to infected fluids during birth. °WHAT ARE THE SIGNS AND SYMPTOMS OF STDs? °Different STDs have different symptoms. Some people may not have any symptoms. If symptoms are present, they may include: °· Painful or bloody urination. °· Pain in the pelvis, abdomen, vagina, anus, throat, or eyes. °· A skin rash, itching, or irritation. °· Growths, ulcerations, blisters, or sores in the genital and anal areas. °· Abnormal vaginal discharge with or without bad odor. °· Penile discharge in men. °· Fever. °· Pain or bleeding during sexual intercourse. °· Swollen glands in the groin area. °· Yellow skin and eyes (jaundice). This is seen with hepatitis. °· Swollen testicles. °· Infertility. °· Sores and blisters in the mouth. °HOW ARE STDs DIAGNOSED? °To make a diagnosis, your health care provider may: °· Take a medical history. °· Perform a physical exam. °· Take a sample of any discharge to examine. °· Swab the throat,  cervix, opening to the penis, rectum, or vagina for testing. °· Test a sample of your first morning urine. °· Perform blood tests. °· Perform a Pap test, if this applies. °· Perform a colposcopy. °· Perform a laparoscopy. °HOW ARE STDs TREATED? °Treatment depends on the STD. Some STDs may be treated but not cured. °· Chlamydia, gonorrhea, trichomonas, and syphilis can be cured with antibiotic medicine. °· Genital herpes, hepatitis, and HIV can be treated, but not cured, with prescribed medicines. The medicines lessen symptoms. °· Genital warts from HPV can be treated with medicine or by freezing, burning (electrocautery), or surgery. Warts may come back. °· HPV cannot be cured with medicine or surgery. However, abnormal areas may be removed from the cervix, vagina, or vulva. °· If your diagnosis is confirmed, your recent sexual partners need treatment. This is true even if they are symptom-free or have a negative culture or evaluation. They should not have sex until their health care providers say it is okay. °· Your health care provider may test you for infection again 3 months after treatment. °HOW CAN I REDUCE MY RISK OF GETTING AN STD? °Take these steps to reduce your risk of getting an STD: °· Use latex condoms, dental dams, and water-soluble lubricants during sexual activity. Do not use petroleum jelly or oils. °· Avoid having multiple sex partners. °· Do not have sex with someone who has other sex partners °· Do not have sex with anyone you do not know or who is at high risk for an STD. °· Avoid risky sex practices that can break your skin. °· Do not have sex   if you have open sores on your mouth or skin.  Avoid drinking too much alcohol or taking illegal drugs. Alcohol and drugs can affect your judgment and put you in a vulnerable position.  Avoid engaging in oral and anal sex acts.  Get vaccinated for HPV and hepatitis. If you have not received these vaccines in the past, talk to your health care  provider about whether one or both might be right for you.  If you are at risk of being infected with HIV, it is recommended that you take a prescription medicine daily to prevent HIV infection. This is called pre-exposure prophylaxis (PrEP). You are considered at risk if:  You are a man who has sex with other men (MSM).  You are a heterosexual man or woman and are sexually active with more than one partner.  You take drugs by injection.  You are sexually active with a partner who has HIV.  Talk with your health care provider about whether you are at high risk of being infected with HIV. If you choose to begin PrEP, you should first be tested for HIV. You should then be tested every 3 months for as long as you are taking PrEP. WHAT SHOULD I DO IF I THINK I HAVE AN STD?  See your health care provider.  Tell your sexual partner(s). They should be tested and treated for any STDs.  Do not have sex until your health care provider says it is okay. WHEN SHOULD I GET IMMEDIATE MEDICAL CARE? Contact your health care provider right away if:   You have severe abdominal pain.  You are a man and notice swelling or pain in your testicles.  You are a woman and notice swelling or pain in your vagina.   This information is not intended to replace advice given to you by your health care provider. Make sure you discuss any questions you have with your health care provider.   Document Released: 01/28/2003 Document Revised: 11/28/2014 Document Reviewed: 05/28/2013 Elsevier Interactive Patient Education 2016 [employer]. Trichomoniasis Trichomoniasis is an infection caused by an organism called Trichomonas. The infection can affect both women and men. In women, the outer female genitalia and the vagina are affected. In men, the penis is mainly affected, but the prostate and other reproductive organs can also be involved. Trichomoniasis is a sexually transmitted infection (STI) and is most often passed  to another person through sexual contact.  RISK FACTORS  Having unprotected sexual intercourse.  Having sexual intercourse with an infected partner. SIGNS AND SYMPTOMS  Symptoms of trichomoniasis in women include:  Abnormal gray-green frothy vaginal discharge.  Itching and irritation of the vagina.  Itching and irritation of the area outside the vagina. Symptoms of trichomoniasis in men include:   Penile discharge with or without pain.  Pain during urination. This results from inflammation of the urethra. DIAGNOSIS  Trichomoniasis may be found during a Pap test or physical exam. Your health care provider may use one of the following methods to help diagnose this infection:  Testing the pH of the vagina with a test tape.  Using a vaginal swab test that checks for the Trichomonas organism. A test is available that provides results within a few minutes.  Examining a urine sample.  Testing vaginal secretions. Your health care provider may test you for other STIs, including HIV. TREATMENT   You may be given medicine to fight the infection. Women should inform their health care provider if they could be or are  pregnant. Some medicines used to treat the infection should not be taken during pregnancy.  Your health care provider may recommend over-the-counter medicines or creams to decrease itching or irritation.  Your sexual partner will need to be treated if infected.  Your health care provider may test you for infection again 3 months after treatment. HOME CARE INSTRUCTIONS   Take medicines only as directed by your health care provider.  Take over-the-counter medicine for itching or irritation as directed by your health care provider.  Do not have sexual intercourse while you have the infection.  Women should not douche or wear tampons while they have the infection.  Discuss your infection with your partner. Your partner may have gotten the infection from you, or you may  have gotten it from your partner.  Have your sex partner get examined and treated if necessary.  Practice safe, informed, and protected sex.  See your health care provider for other STI testing. SEEK MEDICAL CARE IF:   You still have symptoms after you finish your medicine.  You develop abdominal pain.  You have pain when you urinate.  You have bleeding after sexual intercourse.  You develop a rash.  Your medicine makes you sick or makes you throw up (vomit). MAKE SURE YOU:  Understand these instructions.  Will watch your condition.  Will get help right away if you are not doing well or get worse.   This information is not intended to replace advice given to you by your health care provider. Make sure you discuss any questions you have with your health care provider.   Document Released: 05/03/2001 Document Revised: 11/28/2014 Document Reviewed: 08/19/2013 Elsevier Interactive Patient Education 2016 [employer]. Safe Sex Safe sex is about reducing the risk of giving or getting a sexually transmitted disease (STD). STDs are spread through sexual contact involving the genitals, mouth, or rectum. Some STDs can be cured and others cannot. Safe sex can also prevent unintended pregnancies.  WHAT ARE SOME SAFE SEX PRACTICES?  Limit your sexual activity to only one partner who is having sex with only you.  Talk to your partner about his or her past partners, past STDs, and drug use.  Use a condom every time you have sexual intercourse. This includes vaginal, oral, and anal sexual activity. Both females and males should wear condoms during oral sex. Only use latex or polyurethane condoms and water-based lubricants. Using petroleum-based lubricants or oils to lubricate a condom will weaken the condom and increase the chance that it will break. The condom should be in place from the beginning to the end of sexual activity. Wearing a condom reduces, but does not completely eliminate,  your risk of getting or giving an STD. STDs can be spread by contact with infected body fluids and skin.  Get vaccinated for hepatitis B and HPV.  Avoid alcohol and recreational drugs, which can affect your judgment. You may forget to use a condom or participate in high-risk sex.  For females, avoid douching after sexual intercourse. Douching can spread an infection farther into the reproductive tract.  Check your body for signs of sores, blisters, rashes, or unusual discharge. See your health care provider if you notice any of these signs.  Avoid sexual contact if you have symptoms of an infection or are being treated for an STD. If you or your partner has herpes, avoid sexual contact when blisters are present. Use condoms at all other times.  If you are at risk of being infected with  HIV, it is recommended that you take a prescription medicine daily to prevent HIV infection. This is called pre-exposure prophylaxis (PrEP). You are considered at risk if:  You are a man who has sex with other men (MSM).  You are a heterosexual man or woman who is sexually active with more than one partner.  You take drugs by injection.  You are sexually active with a partner who has HIV.  Talk with your health care provider about whether you are at high risk of being infected with HIV. If you choose to begin PrEP, you should first be tested for HIV. You should then be tested every 3 months for as long as you are taking PrEP.  See your health care provider for regular screenings, exams, and tests for other STDs. Before having sex with a new partner, each of you should be screened for STDs and should talk about the results with each other. WHAT ARE THE BENEFITS OF SAFE SEX?   There is less chance of getting or giving an STD.  You can prevent unwanted or unintended pregnancies.  By discussing safe sex concerns with your partner, you may increase feelings of intimacy, comfort, trust, and honesty between the  two of you.   This information is not intended to replace advice given to you by your health care provider. Make sure you discuss any questions you have with your health care provider.   Document Released: 12/15/2004 Document Revised: 11/28/2014 Document Reviewed: 04/30/2012 Elsevier Interactive Patient Education [employer]2016 [employer] Inc.

## 2015-09-25 NOTE — MAU Note (Addendum)
Pt reports discharge for 3 weeks and lower back pain. Pt also reports that she has a "boil on her private area". Pt reports that she had a new partner recently and wants to make sure that she is healthy.

## 2015-09-25 NOTE — MAU Provider Note (Signed)
History     CSN: [id number]  Arrival date and time: 09/25/15 1120     First Provider Initiated Contact with Patient 09/25/15 1334      Chief Complaint  Patient presents with  . Vaginal Discharge   The patient is a 24 y.o. female G1P1001 who presents c/o white vaginal discharge x3 weeks with a slight odor after a single encounter with a new sexual partner 1 month ago. The patient also c/o frequency and urgency; denies dysuria, abdominal pain, N/V, vaginal bleeding, fevers or chills. 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   Vaginal Discharge The patient's primary symptoms include genital lesions (lump on mons pubis) and vaginal discharge. The patient's pertinent negatives include no genital itching, pelvic pain or vaginal bleeding. This is a new problem. The current episode started 1 to 4 weeks ago. The problem has been unchanged. The pain is mild. The problem affects both sides. She is not pregnant. Associated symptoms include frequency and urgency. Pertinent negatives include no abdominal pain, chills, constipation, diarrhea, dysuria, fever, flank pain, hematuria, nausea, rash or vomiting. The vaginal discharge was milky. There has been no bleeding. She has not been passing clots. She has not been passing tissue. Nothing aggravates the symptoms. She has tried nothing for the symptoms. She is sexually active with multiple partners. It is possible that her partner has an STD. She uses nothing for contraception.     OB History    Gravida Para Term Preterm AB TAB SAB Ectopic Multiple Living   Past Medical History  Diagnosis Date  . Anxiety   . DVT (deep vein thrombosis) in pregnancy 8 years ago-- right leg    Past Surgical History  Procedure Laterality Date  . Tonsillectomy    . Eye surgery      No family history on file.  Social History  Substance Use Topics  . Smoking status:  Current Every Day Smoker  . Smokeless tobacco: None  . Alcohol Use: Yes     Comment: occasionaly    Allergies: No Known Allergies  Prescriptions prior to admission  Medication Sig Dispense Refill Last Dose  . Ibuprofen-Diphenhydramine Cit (ADVIL PM PO) Take 1 tablet by mouth at bedtime as needed (pain / sleep).   Past Month at Unknown time  . Pseudoephedrine HCl (SUDAFED PO) Take 2 tablets by mouth 2 (two) times daily as needed (allergies).   Past Month at Unknown time    Review of Systems  Constitutional: Negative for fever, chills and diaphoresis.  Respiratory: Negative for cough and shortness of breath.   Cardiovascular: Negative.   Gastrointestinal: Negative for heartburn, nausea, vomiting, abdominal pain, diarrhea and constipation.  Genitourinary: Positive for urgency, frequency and vaginal discharge. Negative for dysuria, hematuria, flank pain and pelvic pain.  Skin: Negative for itching and rash.       Single suprapubic tender lump.   All other systems reviewed and are negative.  Physical Exam   Blood pressure 134/87, pulse 101, temperature 98 F (36.7 C), temperature source Oral, resp. rate 18, last menstrual period 09/03/2015.  Physical Exam  Constitutional: She is oriented to person, place, and time. She appears well-developed and well-nourished. No distress.  HENT:  Head: Normocephalic and atraumatic.  Neck: Normal range of motion. Neck supple.  Cardiovascular: Normal rate.   Respiratory: Effort normal and breath sounds normal.  No respiratory distress.  GI: Soft. Bowel sounds are normal. She exhibits no distension and no mass. There is no tenderness. There is no guarding.  Genitourinary: Uterus normal.    There is no rash or lesion on the right labia. There is no rash or lesion on the left labia. Uterus is not enlarged and not tender. Cervix exhibits no motion tenderness. Right adnexum displays no mass and no tenderness. Left adnexum displays no mass and no  tenderness. No tenderness in the vagina. Vaginal discharge found.  EGBUS normal except for positive vaginal discharge and indurated and erythematous non-fluctuant superficial mass approx 2cm diameter on mons pubis.     Musculoskeletal: Normal range of motion.  Neurological: She is alert and oriented to person, place, and time.  Skin: Skin is warm and dry. She is not diaphoretic.   Results for orders placed or performed during the hospital encounter of 09/25/15 (from the past 24 hour(s))  Urinalysis, Routine w reflex microscopic (not [facility])     Status: Abnormal   Collection Time: 09/25/15 11:50 AM  Result Value Ref Range   Color, Urine YELLOW YELLOW   APPearance CLEAR CLEAR   Specific Gravity, Urine 1.020 1.005 - 1.030   pH 5.5 5.0 - 8.0   Glucose, UA NEGATIVE NEGATIVE mg/dL   Hgb urine dipstick TRACE (A) NEGATIVE   Bilirubin Urine NEGATIVE NEGATIVE   Ketones, ur NEGATIVE NEGATIVE mg/dL   Protein, ur NEGATIVE NEGATIVE mg/dL   Urobilinogen, UA 0.2 0.0 - 1.0 mg/dL   Nitrite NEGATIVE NEGATIVE   Leukocytes, UA MODERATE (A) NEGATIVE  Urine microscopic-add on     Status: Abnormal   Collection Time: 09/25/15 11:50 AM  Result Value Ref Range   Squamous Epithelial / LPF MANY (A) RARE   WBC, UA 7-10 <3 WBC/hpf   RBC / HPF 3-6 <3 RBC/hpf   Bacteria, UA MANY (A) RARE   Urine-Other MUCOUS PRESENT   Pregnancy, urine POC     Status: None   Collection Time: 09/25/15 12:04 PM  Result Value Ref Range   Preg Test, Ur NEGATIVE NEGATIVE    MAU Course  Procedures  MDM UA/UPT- trichomonas noted on UA, UA neg GC/CHL-pending HIV-pending  Given Flagyl 2gm for treatement of Trichomonas  Assessment and Plan  A:  Trichomoniasis-.        Risky sexual behavior       Furuncle on mons pubis, not fluctuant  P:   flagyl 2g PO here today, denies wanting partner therapy        Pending GC/CHL/HIV        Furuncle not amenable for I&D today        Advised warm compresses to area         Offered  expedited partner therapy, declines         Long discussion of risks to unprotected IC and risky sexual behavior  [facility]ILLIAMS,MARIE 09/25/2015, 1:52 PM

## 2015-09-25 NOTE — MAU Note (Addendum)
Pt having vaginal discharge x 3 weeks, white, slight odor.  Also has knot on pubic area - found 2 days ago.  Denies bleeding.  Has new sex partner.

## 2015-09-26 LAB — HIV ANTIBODY (ROUTINE TESTING W REFLEX): HIV Screen 4th Generation wRfx: NONREACTIVE

## 2015-09-28 LAB — GC/CHLAMYDIA PROBE AMP (~~LOC~~) NOT AT ARMC
CHLAMYDIA, DNA PROBE: NEGATIVE
NEISSERIA GONORRHEA: POSITIVE — AB

## 2016-01-14 ENCOUNTER — Emergency Department (HOSPITAL_COMMUNITY)
Admission: EM | Admit: 2016-01-14 | Discharge: 2016-01-14 | Disposition: A | Discharge status: Home / Self Care | Payer: BC Managed Care – PPO | Attending: Emergency Medicine | Admitting: Emergency Medicine

## 2016-01-14 ENCOUNTER — Encounter (HOSPITAL_COMMUNITY): Payer: Self-pay | Admitting: Family Medicine

## 2016-01-14 DIAGNOSIS — Z8659 Personal history of other mental and behavioral disorders: Secondary | ICD-10-CM | POA: Insufficient documentation

## 2016-01-14 DIAGNOSIS — F17200 Nicotine dependence, unspecified, uncomplicated: Secondary | ICD-10-CM | POA: Insufficient documentation

## 2016-01-14 DIAGNOSIS — J111 Influenza due to unidentified influenza virus with other respiratory manifestations: Secondary | ICD-10-CM

## 2016-01-14 DIAGNOSIS — Z3202 Encounter for pregnancy test, result negative: Secondary | ICD-10-CM | POA: Insufficient documentation

## 2016-01-14 DIAGNOSIS — Z86718 Personal history of other venous thrombosis and embolism: Secondary | ICD-10-CM | POA: Insufficient documentation

## 2016-01-14 DIAGNOSIS — R112 Nausea with vomiting, unspecified: Secondary | ICD-10-CM | POA: Insufficient documentation

## 2016-01-14 LAB — COMPREHENSIVE METABOLIC PANEL
ALK PHOS: 46 U/L (ref 38–126)
ALT: 14 U/L (ref 14–54)
ANION GAP: 11 (ref 5–15)
AST: 22 U/L (ref 15–41)
Albumin: 4.1 g/dL (ref 3.5–5.0)
BILIRUBIN TOTAL: 0.5 mg/dL (ref 0.3–1.2)
BUN: 9 mg/dL (ref 6–20)
CALCIUM: 9.5 mg/dL (ref 8.9–10.3)
CO2: 21 mmol/L — ABNORMAL LOW (ref 22–32)
CREATININE: 0.76 mg/dL (ref 0.44–1.00)
Chloride: 104 mmol/L (ref 101–111)
Glucose, Bld: 81 mg/dL (ref 65–99)
Potassium: 4 mmol/L (ref 3.5–5.1)
Sodium: 136 mmol/L (ref 135–145)
TOTAL PROTEIN: 7.8 g/dL (ref 6.5–8.1)

## 2016-01-14 LAB — CBC
HCT: 34.1 % — ABNORMAL LOW (ref 36.0–46.0)
HEMOGLOBIN: 11.1 g/dL — AB (ref 12.0–15.0)
MCH: 24.4 pg — AB (ref 26.0–34.0)
MCHC: 32.6 g/dL (ref 30.0–36.0)
MCV: 75.1 fL — ABNORMAL LOW (ref 78.0–100.0)
PLATELETS: 306 10*3/uL (ref 150–400)
RBC: 4.54 MIL/uL (ref 3.87–5.11)
RDW: 13.4 % (ref 11.5–15.5)
WBC: 6.8 10*3/uL (ref 4.0–10.5)

## 2016-01-14 LAB — LIPASE, BLOOD: Lipase: 20 U/L (ref 11–51)

## 2016-01-14 LAB — URINE MICROSCOPIC-ADD ON: RBC / HPF: NONE SEEN RBC/hpf (ref 0–5)

## 2016-01-14 LAB — I-STAT BETA HCG BLOOD, ED (MC, WL, AP ONLY)

## 2016-01-14 LAB — URINALYSIS, ROUTINE W REFLEX MICROSCOPIC
Glucose, UA: NEGATIVE mg/dL
HGB URINE DIPSTICK: NEGATIVE
Ketones, ur: >80 mg/dL — AB
Leukocytes, UA: NEGATIVE
NITRITE: NEGATIVE
PROTEIN: 30 mg/dL — AB
Specific Gravity, Urine: 1.028 (ref 1.005–1.030)
pH: 7.5 (ref 5.0–8.0)

## 2016-01-14 MED ORDER — ONDANSETRON HCL 4 MG PO TABS: 4.0000 mg | ORAL_TABLET | Freq: Four times a day (QID) | ORAL | Status: AC

## 2016-01-14 MED ORDER — KETOROLAC TROMETHAMINE 30 MG/ML IJ SOLN
30.0000 mg | Freq: Once | INTRAMUSCULAR | Status: AC
  Administered 2016-01-14: 30 mg via INTRAVENOUS
  Filled 2016-01-14: qty 1

## 2016-01-14 MED ORDER — ACETAMINOPHEN 500 MG PO TABS
1000.0000 mg | ORAL_TABLET | Freq: Once | ORAL | Status: AC
  Administered 2016-01-14: 1000 mg via ORAL
  Filled 2016-01-14: qty 2

## 2016-01-14 MED ORDER — SODIUM CHLORIDE 0.9 % IV BOLUS (SEPSIS)
1000.0000 mL | Freq: Once | INTRAVENOUS | Status: AC
  Administered 2016-01-14: 1000 mL via INTRAVENOUS

## 2016-01-14 MED ORDER — ONDANSETRON HCL 4 MG/2ML IJ SOLN
4.0000 mg | Freq: Once | INTRAMUSCULAR | Status: AC
  Administered 2016-01-14: 4 mg via INTRAVENOUS
  Filled 2016-01-14: qty 2

## 2016-01-14 MED ORDER — ACETAMINOPHEN 500 MG PO TABS: 500.0000 mg | ORAL_TABLET | Freq: Four times a day (QID) | ORAL | Status: AC | PRN

## 2016-01-14 MED ORDER — IBUPROFEN 600 MG PO TABS: 600.0000 mg | ORAL_TABLET | Freq: Four times a day (QID) | ORAL | Status: DC | PRN

## 2016-01-14 NOTE — ED Provider Notes (Signed)
CSN: [id number]     Arrival date & time 01/14/16  1022 History   First MD Initiated Contact with Patient 01/14/16 1600     Chief Complaint  Patient presents with  . Generalized Body Aches  . Chills  . Emesis   Patient is a 25 y.o. female presenting with fever. The history is provided by the patient and a significant other. No language interpreter was used.  Fever Temp source:  Subjective Severity:  Mild Onset quality:  Gradual Duration:  1 day Timing:  Constant Progression:  Unchanged Chronicity:  New Relieved by:  None tried Worsened by:  Nothing tried Ineffective treatments:  None tried Associated symptoms: chills, congestion, cough, myalgias, nausea, sore throat and vomiting   Associated symptoms: no chest pain, no confusion, no diarrhea, no ear pain, no headaches, no rash and no rhinorrhea   Risk factors: hx of cancer and sick contacts   Risk factors: no immunosuppression     Past Medical History  Diagnosis Date  . Anxiety   . DVT (deep vein thrombosis) in pregnancy 8 years ago-- right leg   Past Surgical History  Procedure Laterality Date  . Tonsillectomy    . Eye surgery     History reviewed. No pertinent family history. Social History  Substance Use Topics  . Smoking status: Current Every Day Smoker  . Smokeless tobacco: None  . Alcohol Use: Yes     Comment: occasionaly   OB History    Gravida Para Term Preterm AB TAB SAB Ectopic Multiple Living   Review of Systems  Constitutional: Positive for fever and chills. Negative for activity change and appetite change.  HENT: Positive for congestion and sore throat. Negative for dental problem, ear pain, facial swelling, hearing loss, rhinorrhea, sneezing, trouble swallowing and voice change.   Eyes: Negative for photophobia, pain, redness and visual disturbance.  Respiratory: Positive for cough. Negative for apnea, chest tightness, shortness of breath, wheezing and stridor.   Cardiovascular:  Negative for chest pain, palpitations and leg swelling.  Gastrointestinal: Positive for nausea and vomiting. Negative for abdominal pain, diarrhea, constipation, blood in stool and abdominal distention.  Endocrine: Negative for polydipsia and polyuria.  Genitourinary: Negative for frequency, hematuria, flank pain, decreased urine volume and difficulty urinating.  Musculoskeletal: Positive for myalgias. Negative for back pain, joint swelling, gait problem, neck pain and neck stiffness.  Skin: Negative for rash and wound.  Allergic/Immunologic: Negative for immunocompromised state.  Neurological: Negative for dizziness, syncope, facial asymmetry, speech difficulty, weakness, light-headedness, numbness and headaches.  Hematological: Negative for adenopathy.  Psychiatric/Behavioral: Negative for suicidal ideas, behavioral problems, confusion, sleep disturbance and agitation. The patient is not nervous/anxious.   All other systems reviewed and are negative.     Allergies  Review of patient's allergies indicates no known allergies.  Home Medications   Prior to Admission medications   Medication Sig Start Date End Date Taking? Authorizing Provider  acetaminophen (TYLENOL) 500 MG tablet Take 1 tablet (500 mg total) by mouth every 6 (six) hours as needed. 01/14/16   Dan Humphreys, MD  ibuprofen (ADVIL,MOTRIN) 600 MG tablet Take 1 tablet (600 mg total) by mouth every 6 (six) hours as needed. 01/14/16   Dan Humphreys, MD  ondansetron (ZOFRAN) 4 MG tablet Take 1 tablet (4 mg total) by mouth every 6 (six) hours. 01/14/16   Dan Humphreys, MD   BP 109/73 mmHg  Pulse 92  Temp(Src) 98.9 F (  37.2 C) (Oral)  Resp 22  SpO2 100%  LMP 12/19/2015 Physical Exam  Constitutional: She is oriented to person, place, and time. She appears well-developed and well-nourished. No distress.  HENT:  Head: Normocephalic and atraumatic.  Right Ear: External ear normal.  Left Ear: External ear normal.  Eyes: Pupils are  equal, round, and reactive to light. Right eye exhibits no discharge. Left eye exhibits no discharge.  Neck: Normal range of motion. No JVD present. No tracheal deviation present.  Cardiovascular: Normal rate, regular rhythm and normal heart sounds.  Exam reveals no friction rub.   No murmur heard. Pulmonary/Chest: Effort normal and breath sounds normal. No stridor. No respiratory distress. She has no wheezes.  Abdominal: Soft. Bowel sounds are normal. She exhibits no distension. There is no rebound and no guarding.  Musculoskeletal: Normal range of motion. She exhibits no edema or tenderness.  Lymphadenopathy:    She has no cervical adenopathy.  Neurological: She is alert and oriented to person, place, and time. No cranial nerve deficit. Coordination normal.  Skin: Skin is warm and dry. No rash noted. No pallor.  Psychiatric: She has a normal mood and affect. Her behavior is normal. Judgment and thought content normal.  Nursing note and vitals reviewed.   ED Course  Procedures (including critical care time) Labs Review Labs Reviewed  COMPREHENSIVE METABOLIC PANEL - Abnormal; Notable for the following:    CO2 21 (*)    All other components within normal limits  CBC - Abnormal; Notable for the following:    Hemoglobin 11.1 (*)    HCT 34.1 (*)    MCV 75.1 (*)    MCH 24.4 (*)    All other components within normal limits  URINALYSIS, ROUTINE W REFLEX MICROSCOPIC (NOT AT [facility]) - Abnormal; Notable for the following:    Color, Urine AMBER (*)    APPearance HAZY (*)    Bilirubin Urine SMALL (*)    Ketones, ur >80 (*)    Protein, ur 30 (*)    All other components within normal limits  URINE MICROSCOPIC-ADD ON - Abnormal; Notable for the following:    Squamous Epithelial / LPF 6-30 (*)    Bacteria, UA RARE (*)    All other components within normal limits  LIPASE, BLOOD  I-STAT BETA HCG BLOOD, ED ([facility], [facility], [facility] ONLY)    Imaging Review No results found. I have personally reviewed and  evaluated these images and lab results as part of my medical decision-making.   EKG Interpretation None      MDM   Final diagnoses:  Influenza    Patient with no significant medical history presents with 1 day of fever, chills, myalgias, nausea, vomiting, sore throat, lightheadedness. She not take any meds prior to arrival. She has a sick contact in a close friend who had flulike symptoms.  Patient tachycardic upon arrival. No fever with oral temp. Patient appears comfortable in her bed. She had nontender nondistended abdomen. She had normal throat without tonsillar abscess or erythema.  Suspect flu. Offered IV fluids, antiemetics, Tylenol. Discussed Tamiflu patient declined due to side effects. No focal lung findings to suggest pneumonia.   Labs mild dehydration without UTI. Lipase normal, CMP unremarkable, CBC with normal white blood cell count.  6:45 PM: Patient reevaluated with improvement in her symptoms area likely viral illness potentially influenza. Prescription for ibuprofen, Tylenol, Zofran home and encouraged to maintain good by mouth hydration. Patient to follow-up with primary care physician as needed.  Discussed case  by attending Dr. Clydene Pugh.  Dan Humphreys, MD 01/14/16 4098  Lyndal Pulley, MD 01/15/16 [phone]

## 2016-01-14 NOTE — ED Notes (Signed)
Pt here for waking up this am with N,V,body aches and chills. Pt moving around in chair in a panic. sts that she is dizzy. Pt instructed to slow breathing.

## 2016-01-14 NOTE — Discharge Instructions (Signed)

## 2016-01-14 NOTE — ED Notes (Signed)
Unable to draw blood due to pt lying in the floor rolling around. Pt taken back to waiting area.

## 2017-02-24 ENCOUNTER — Encounter (HOSPITAL_COMMUNITY): Payer: Self-pay | Admitting: Emergency Medicine

## 2017-02-24 ENCOUNTER — Emergency Department (HOSPITAL_COMMUNITY)
Admission: EM | Admit: 2017-02-24 | Discharge: 2017-02-24 | Disposition: A | Discharge status: Home / Self Care | Payer: Self-pay | Attending: Emergency Medicine | Admitting: Emergency Medicine

## 2017-02-24 DIAGNOSIS — R6889 Other general symptoms and signs: Secondary | ICD-10-CM

## 2017-02-24 DIAGNOSIS — R10817 Generalized abdominal tenderness: Secondary | ICD-10-CM | POA: Insufficient documentation

## 2017-02-24 DIAGNOSIS — R112 Nausea with vomiting, unspecified: Secondary | ICD-10-CM | POA: Insufficient documentation

## 2017-02-24 DIAGNOSIS — F17200 Nicotine dependence, unspecified, uncomplicated: Secondary | ICD-10-CM | POA: Insufficient documentation

## 2017-02-24 LAB — CBC
HEMATOCRIT: 35.5 % — AB (ref 36.0–46.0)
Hemoglobin: 11.7 g/dL — ABNORMAL LOW (ref 12.0–15.0)
MCH: 24.8 pg — AB (ref 26.0–34.0)
MCHC: 33 g/dL (ref 30.0–36.0)
MCV: 75.4 fL — AB (ref 78.0–100.0)
PLATELETS: 328 10*3/uL (ref 150–400)
RBC: 4.71 MIL/uL (ref 3.87–5.11)
RDW: 13.4 % (ref 11.5–15.5)
WBC: 13.9 10*3/uL — ABNORMAL HIGH (ref 4.0–10.5)

## 2017-02-24 LAB — COMPREHENSIVE METABOLIC PANEL
ALBUMIN: 3.9 g/dL (ref 3.5–5.0)
ALT: 19 U/L (ref 14–54)
AST: 21 U/L (ref 15–41)
Alkaline Phosphatase: 43 U/L (ref 38–126)
Anion gap: 11 (ref 5–15)
BILIRUBIN TOTAL: 0.7 mg/dL (ref 0.3–1.2)
BUN: 12 mg/dL (ref 6–20)
CHLORIDE: 101 mmol/L (ref 101–111)
CO2: 23 mmol/L (ref 22–32)
CREATININE: 0.71 mg/dL (ref 0.44–1.00)
Calcium: 9.4 mg/dL (ref 8.9–10.3)
GFR calc Af Amer: >60 mL/min (ref >60)
GLUCOSE: 85 mg/dL (ref 65–99)
Potassium: 4 mmol/L (ref 3.5–5.1)
Sodium: 135 mmol/L (ref 135–145)
Total Protein: 7.2 g/dL (ref 6.5–8.1)

## 2017-02-24 LAB — I-STAT BETA HCG BLOOD, ED (MC, WL, AP ONLY): I-stat hCG, quantitative: <5 m[IU]/mL (ref <5)

## 2017-02-24 LAB — LIPASE, BLOOD: Lipase: 16 U/L (ref 11–51)

## 2017-02-24 MED ORDER — ONDANSETRON 4 MG PO TBDP
ORAL_TABLET | ORAL | Status: AC
  Filled 2017-02-24: qty 1

## 2017-02-24 MED ORDER — ONDANSETRON 4 MG PO TBDP
4.0000 mg | ORAL_TABLET | Freq: Once | ORAL | Status: AC | PRN
  Administered 2017-02-24: 4 mg via ORAL

## 2017-02-24 MED ORDER — CYCLOBENZAPRINE HCL 10 MG PO TABS
5.0000 mg | ORAL_TABLET | Freq: Once | ORAL | Status: AC
  Administered 2017-02-24: 5 mg via ORAL
  Filled 2017-02-24: qty 1

## 2017-02-24 MED ORDER — KETOROLAC TROMETHAMINE 30 MG/ML IJ SOLN
30.0000 mg | Freq: Once | INTRAMUSCULAR | Status: AC
  Administered 2017-02-24: 30 mg via INTRAMUSCULAR
  Filled 2017-02-24: qty 1

## 2017-02-24 NOTE — ED Provider Notes (Signed)
[facility]Y DEPT Provider Note   CSN: [id number] Arrival date & time: 02/24/17  1406     History   Chief Complaint Chief Complaint  Patient presents with  . Emesis  . Generalized Body Aches    HPI Nancy Williamson is a 26 y.o. female.  Pt presents w acute onset generalized body aches and nausea that began this morning. Pt reports aches are constant w intermittent shooting pains down legs. States she has not taken anything for her pain today. Reports episodes of nonbilious, non bloody vomiting this morning as well assoc w diffuse abd pain. Pt also reports sore throat and HA. Denies cough, CP, urinary sx, flank pain, vaginal bleeding or discharge.       Past Medical History:  Diagnosis Date  . Anxiety   . DVT (deep vein thrombosis) in pregnancy (HCC) 8 years ago-- right leg    There are no active problems to display for this patient.   Past Surgical History:  Procedure Laterality Date  . EYE SURGERY    . TONSILLECTOMY      OB History    Gravida Para Term Preterm AB Living   SAB TAB Ectopic Multiple Live Births           1       Home Medications    Prior to Admission medications   Medication Sig Start Date End Date Taking? Authorizing Provider  acetaminophen (TYLENOL) 500 MG tablet Take 1 tablet (500 mg total) by mouth every 6 (six) hours as needed. 01/14/16   Dan Humphreys, MD  ibuprofen (ADVIL,MOTRIN) 600 MG tablet Take 1 tablet (600 mg total) by mouth every 6 (six) hours as needed. 01/14/16   Dan Humphreys, MD  ondansetron (ZOFRAN) 4 MG tablet Take 1 tablet (4 mg total) by mouth every 6 (six) hours. 01/14/16   Dan Humphreys, MD    Family History History reviewed. No pertinent family history.  Social History Social History  Substance Use Topics  . Smoking status: Current Every Day Smoker  . Smokeless tobacco: Not on file  . Alcohol use Yes     Comment: occasionaly     Allergies   Patient has no known allergies.   Review of  Systems Review of Systems  Constitutional: Positive for chills and fever.  HENT: Positive for ear pain and sore throat. Negative for congestion.   Respiratory: Negative for cough and shortness of breath.   Cardiovascular: Negative for chest pain.  Gastrointestinal: Positive for abdominal pain (diffuse), diarrhea, nausea and vomiting. Negative for blood in stool.  Genitourinary: Negative for dysuria, flank pain, frequency, vaginal bleeding and vaginal discharge.  Musculoskeletal: Positive for myalgias (diffuse).  Neurological: Positive for headaches.  Psychiatric/Behavioral: The patient is nervous/anxious.      Physical Exam Updated Vital Signs BP 105/66   Pulse 96   Temp 99.8 F (37.7 C) (Oral)   Resp 16   Ht  (1.626 m)   Wt 56.2 kg   SpO2 98%   BMI 21.28 kg/m   Physical Exam  Constitutional: She appears well-developed and well-nourished.  Pt appears uncomfortable  HENT:  Head: Normocephalic and atraumatic.  Right Ear: Tympanic membrane, external ear and ear canal normal.  Left Ear: Tympanic membrane, external ear and ear canal normal.  Mouth/Throat: Oropharynx is clear and moist. No oropharyngeal exudate.  Eyes: Conjunctivae are normal. Pupils are equal, round, and reactive to light.  Neck: Normal range of motion. Neck supple.  Cardiovascular: Normal rate, regular rhythm, normal heart sounds and intact distal pulses.  Exam reveals no friction rub.   No murmur heard. Pulmonary/Chest: Effort normal and breath sounds normal. No respiratory distress. She has no wheezes. She has no rales.  Abdominal: Soft. Bowel sounds are normal. She exhibits no distension and no mass. There is generalized tenderness. There is no rebound, no guarding and no CVA tenderness.  Lymphadenopathy:    She has no cervical adenopathy.  Neurological: She is alert.  Skin: Skin is warm and dry. She is not diaphoretic.  Psychiatric: She has a normal mood and affect. Her behavior is normal.  Nursing  note and vitals reviewed.    ED Treatments / Results  Labs (all labs ordered are listed, but only abnormal results are displayed) Labs Reviewed  CBC - Abnormal; Notable for the following:       Result Value   WBC 13.9 (*)    Hemoglobin 11.7 (*)    HCT 35.5 (*)    MCV 75.4 (*)    MCH 24.8 (*)    All other components within normal limits  LIPASE, BLOOD  COMPREHENSIVE METABOLIC PANEL  I-STAT BETA HCG BLOOD, ED ([facility], [facility], [facility] ONLY)    EKG  EKG Interpretation None       Radiology No results found.  Procedures Procedures (including critical care time)  Medications Ordered in ED Medications  ondansetron (ZOFRAN-ODT) 4 MG disintegrating tablet (not administered)  ondansetron (ZOFRAN-ODT) disintegrating tablet 4 mg (4 mg Oral Given 02/24/17 1420)  ketorolac (TORADOL) 30 MG/ML injection 30 mg (30 mg Intramuscular Given 02/24/17 1641)  cyclobenzaprine (FLEXERIL) tablet 5 mg (5 mg Oral Given 02/24/17 1641)     Initial Impression / Assessment and Plan / ED Course  I have reviewed the triage vital signs and the nursing notes.  Pertinent labs & imaging results that were available during my care of the patient were reviewed by me and considered in my medical decision making (see chart for details).     Patient with symptoms consistent with influenza-like illness.  Vitals are stable.  No signs of dehydration, tolerating PO's.  Lungs are clear. Due to patient's presentation and physical exam a chest x-ray was not ordered bc likely diagnosis of flu. Toradol, flexeril, zofran given in ED for myalgias w relief of sx. Patient will be discharged with instructions to orally hydrate, rest, and use over-the-counter medications such as anti-inflammatories ibuprofen for muscle aches and Tylenol for fever.   Patient discussed with Dr. Dalene Seltzer. Discussed results, findings, treatment and follow up. Patient advised of return precautions. Patient verbalized understanding and agreed with  plan.   Final Clinical Impressions(s) / ED Diagnoses   Final diagnoses:  Flu-like symptoms    New Prescriptions New Prescriptions   No medications on file     [place] N Russo, PA-C 02/24/17 1802    Alvira Monday, MD 02/26/17 [phone]

## 2017-02-24 NOTE — ED Notes (Signed)
Pt is in stable condition upon d/c and is escorted from ED via wheelchair. 

## 2017-02-24 NOTE — ED Triage Notes (Signed)
Pt here for body aches and nausea starting this am

## 2017-02-24 NOTE — Discharge Instructions (Signed)
Please read instructions below. You can take tylenol or advil for body aches. Drink plenty of water. Return to ER for fever, difficulty breathing, inability to swallow liquids or worsening symptoms.

## 2017-02-28 ENCOUNTER — Emergency Department (HOSPITAL_COMMUNITY)
Admission: EM | Admit: 2017-02-28 | Discharge: 2017-02-28 | Disposition: A | Discharge status: Home / Self Care | Payer: Self-pay | Attending: Emergency Medicine | Admitting: Emergency Medicine

## 2017-02-28 ENCOUNTER — Encounter (HOSPITAL_COMMUNITY): Payer: Self-pay

## 2017-02-28 DIAGNOSIS — Z79899 Other long term (current) drug therapy: Secondary | ICD-10-CM | POA: Insufficient documentation

## 2017-02-28 DIAGNOSIS — F17200 Nicotine dependence, unspecified, uncomplicated: Secondary | ICD-10-CM | POA: Insufficient documentation

## 2017-02-28 DIAGNOSIS — J029 Acute pharyngitis, unspecified: Secondary | ICD-10-CM | POA: Insufficient documentation

## 2017-02-28 DIAGNOSIS — J020 Streptococcal pharyngitis: Secondary | ICD-10-CM

## 2017-02-28 LAB — RAPID STREP SCREEN (MED CTR MEBANE ONLY): STREPTOCOCCUS, GROUP A SCREEN (DIRECT): POSITIVE — AB

## 2017-02-28 MED ORDER — IBUPROFEN 200 MG PO TABS
600.0000 mg | ORAL_TABLET | Freq: Once | ORAL | Status: AC
  Administered 2017-02-28: 600 mg via ORAL
  Filled 2017-02-28: qty 1

## 2017-02-28 MED ORDER — AMOXICILLIN-POT CLAVULANATE 875-125 MG PO TABS: 1.0000 | ORAL_TABLET | Freq: Two times a day (BID) | ORAL | 0 refills | Status: DC

## 2017-02-28 NOTE — ED Triage Notes (Signed)
Per Pt, Pt reports sore throat and bilateral ear pain that started on Friday. Reports a dry cough and body aches. Reports taking Tylenol and Robotussin with no relief.

## 2017-02-28 NOTE — ED Provider Notes (Signed)
[facility] Provider Note   CSN: [id number] Arrival date & time: 02/28/17  4098     History   Chief Complaint Chief Complaint  Patient presents with  . Sore Throat    HPI Nancy Williamson is a 26 y.o. female.  Pt comes in with c/o sore throat and body aches for the last several days. Pt states that she was seen in the er 4 days ago and was diagnosed with flu. She hasn't taken anything. She states that she is having bilateral ear pain      Past Medical History:  Diagnosis Date  . Anxiety   . DVT (deep vein thrombosis) in pregnancy (HCC) 8 years ago-- right leg    There are no active problems to display for this patient.   Past Surgical History:  Procedure Laterality Date  . EYE SURGERY    . TONSILLECTOMY      OB History    Gravida Para Term Preterm AB Living   SAB TAB Ectopic Multiple Live Births           1       Home Medications    Prior to Admission medications   Medication Sig Start Date End Date Taking? Authorizing Provider  acetaminophen (TYLENOL) 500 MG tablet Take 1 tablet (500 mg total) by mouth every 6 (six) hours as needed. 01/14/16   Dan Humphreys, MD  amoxicillin-clavulanate (AUGMENTIN) 875-125 MG tablet Take 1 tablet by mouth every 12 (twelve) hours. 02/28/17   Teressa Lower, NP  ibuprofen (ADVIL,MOTRIN) 600 MG tablet Take 1 tablet (600 mg total) by mouth every 6 (six) hours as needed. 01/14/16   Dan Humphreys, MD  ondansetron (ZOFRAN) 4 MG tablet Take 1 tablet (4 mg total) by mouth every 6 (six) hours. 01/14/16   Dan Humphreys, MD    Family History No family history on file.  Social History Social History  Substance Use Topics  . Smoking status: Current Every Day Smoker  . Smokeless tobacco: Never Used  . Alcohol use Yes     Comment: occasionaly     Allergies   Patient has no known allergies.   Review of Systems Review of Systems  All other systems reviewed and are negative.    Physical Exam Updated  Vital Signs BP (!) 127/100 (BP Location: Left Arm)   Pulse 98   Temp 97.6 F (36.4 C) (Oral)   Resp 18   SpO2 96%   Physical Exam  Constitutional: She is oriented to person, place, and time. She appears well-developed and well-nourished.  HENT:  Right Ear: Tympanic membrane normal.  Left Ear: Tympanic membrane normal.  Mouth/Throat: No oropharyngeal exudate.  Cardiovascular: Normal rate and regular rhythm.   Pulmonary/Chest: Effort normal and breath sounds normal.  Musculoskeletal: Normal range of motion.  Neurological: She is alert and oriented to person, place, and time.  Skin: Skin is warm.  Nursing note and vitals reviewed.    ED Treatments / Results  Labs (all labs ordered are listed, but only abnormal results are displayed) Labs Reviewed  RAPID STREP SCREEN (NOT AT [facility]) - Abnormal; Notable for the following:       Result Value   Streptococcus, Group A Screen (Direct) POSITIVE (*)    All other components within normal limits    EKG  EKG Interpretation None       Radiology No results found.  Procedures Procedures (including critical care time)  Medications Ordered in  ED Medications  ibuprofen (ADVIL,MOTRIN) tablet 600 mg (not administered)     Initial Impression / Assessment and Plan / ED Course  I have reviewed the triage vital signs and the nursing notes.  Pertinent labs & imaging results that were available during my care of the patient were reviewed by me and considered in my medical decision making (see chart for details).     No sign of pta. Will treat with amoxicillin. Discussed return precautions with pt  Final Clinical Impressions(s) / ED Diagnoses   Final diagnoses:  Strep throat    New Prescriptions New Prescriptions   AMOXICILLIN-CLAVULANATE (AUGMENTIN) 875-125 MG TABLET    Take 1 tablet by mouth every 12 (twelve) hours.     Teressa Lower, NP 02/28/17 1610    Cathren Laine, MD 02/28/17 1030

## 2018-01-21 ENCOUNTER — Emergency Department (HOSPITAL_COMMUNITY)
Admission: EM | Admit: 2018-01-21 | Discharge: 2018-01-21 | Disposition: A | Discharge status: Home / Self Care | Payer: Self-pay | Attending: Emergency Medicine | Admitting: Emergency Medicine

## 2018-01-21 ENCOUNTER — Emergency Department (HOSPITAL_COMMUNITY): Payer: Self-pay

## 2018-01-21 ENCOUNTER — Encounter (HOSPITAL_COMMUNITY): Payer: Self-pay

## 2018-01-21 DIAGNOSIS — R6883 Chills (without fever): Secondary | ICD-10-CM | POA: Insufficient documentation

## 2018-01-21 DIAGNOSIS — Z79899 Other long term (current) drug therapy: Secondary | ICD-10-CM | POA: Insufficient documentation

## 2018-01-21 DIAGNOSIS — R42 Dizziness and giddiness: Secondary | ICD-10-CM | POA: Insufficient documentation

## 2018-01-21 DIAGNOSIS — F17200 Nicotine dependence, unspecified, uncomplicated: Secondary | ICD-10-CM | POA: Insufficient documentation

## 2018-01-21 DIAGNOSIS — R079 Chest pain, unspecified: Secondary | ICD-10-CM | POA: Insufficient documentation

## 2018-01-21 DIAGNOSIS — F419 Anxiety disorder, unspecified: Secondary | ICD-10-CM | POA: Insufficient documentation

## 2018-01-21 DIAGNOSIS — F918 Other conduct disorders: Secondary | ICD-10-CM | POA: Insufficient documentation

## 2018-01-21 DIAGNOSIS — R112 Nausea with vomiting, unspecified: Secondary | ICD-10-CM | POA: Insufficient documentation

## 2018-01-21 LAB — URINALYSIS, ROUTINE W REFLEX MICROSCOPIC
Bilirubin Urine: NEGATIVE
Glucose, UA: NEGATIVE mg/dL
Hgb urine dipstick: NEGATIVE
KETONES UR: NEGATIVE mg/dL
LEUKOCYTES UA: NEGATIVE
NITRITE: NEGATIVE
PROTEIN: NEGATIVE mg/dL
Specific Gravity, Urine: 1.024 (ref 1.005–1.030)
pH: 6 (ref 5.0–8.0)

## 2018-01-21 LAB — CBC
HCT: 32.6 % — ABNORMAL LOW (ref 36.0–46.0)
HEMOGLOBIN: 10.9 g/dL — AB (ref 12.0–15.0)
MCH: 25.1 pg — ABNORMAL LOW (ref 26.0–34.0)
MCHC: 33.4 g/dL (ref 30.0–36.0)
MCV: 75.1 fL — ABNORMAL LOW (ref 78.0–100.0)
Platelets: 404 10*3/uL — ABNORMAL HIGH (ref 150–400)
RBC: 4.34 MIL/uL (ref 3.87–5.11)
RDW: 14.1 % (ref 11.5–15.5)
WBC: 8.7 10*3/uL (ref 4.0–10.5)

## 2018-01-21 LAB — BASIC METABOLIC PANEL
ANION GAP: 13 (ref 5–15)
BUN: 9 mg/dL (ref 6–20)
CO2: 21 mmol/L — ABNORMAL LOW (ref 22–32)
Calcium: 8.9 mg/dL (ref 8.9–10.3)
Chloride: 103 mmol/L (ref 101–111)
Creatinine, Ser: 0.78 mg/dL (ref 0.44–1.00)
GFR calc Af Amer: >60 mL/min (ref >60)
GLUCOSE: 88 mg/dL (ref 65–99)
Potassium: 3.5 mmol/L (ref 3.5–5.1)
Sodium: 137 mmol/L (ref 135–145)

## 2018-01-21 LAB — LIPASE, BLOOD: LIPASE: 26 U/L (ref 11–51)

## 2018-01-21 LAB — I-STAT BETA HCG BLOOD, ED (MC, WL, AP ONLY)

## 2018-01-21 LAB — HEPATIC FUNCTION PANEL
ALBUMIN: 3.9 g/dL (ref 3.5–5.0)
ALT: 37 U/L (ref 14–54)
AST: 32 U/L (ref 15–41)
Alkaline Phosphatase: 45 U/L (ref 38–126)
Bilirubin, Direct: <0.1 mg/dL — ABNORMAL LOW (ref 0.1–0.5)
TOTAL PROTEIN: 7.9 g/dL (ref 6.5–8.1)
Total Bilirubin: 0.5 mg/dL (ref 0.3–1.2)

## 2018-01-21 LAB — I-STAT TROPONIN, ED: Troponin i, poc: 0 ng/mL (ref 0.00–0.08)

## 2018-01-21 LAB — D-DIMER, QUANTITATIVE (NOT AT ARMC): D DIMER QUANT: 0.28 ug{FEU}/mL (ref 0.00–0.50)

## 2018-01-21 MED ORDER — SODIUM CHLORIDE 0.9 % IV BOLUS (SEPSIS)
1000.0000 mL | Freq: Once | INTRAVENOUS | Status: AC
  Administered 2018-01-21: 1000 mL via INTRAVENOUS

## 2018-01-21 MED ORDER — ONDANSETRON 4 MG PO TBDP
8.0000 mg | ORAL_TABLET | Freq: Once | ORAL | Status: AC
  Administered 2018-01-21: 8 mg via ORAL
  Filled 2018-01-21: qty 2

## 2018-01-21 MED ORDER — ONDANSETRON 8 MG PO TBDP: 8.0000 mg | ORAL_TABLET | Freq: Three times a day (TID) | ORAL | 0 refills | Status: AC | PRN

## 2018-01-21 MED ORDER — MORPHINE SULFATE (PF) 4 MG/ML IV SOLN
2.0000 mg | Freq: Once | INTRAVENOUS | Status: AC
  Administered 2018-01-21: 2 mg via INTRAVENOUS
  Filled 2018-01-21: qty 1

## 2018-01-21 NOTE — Discharge Instructions (Signed)
Please use zofran for nausea.  Take clear liquids only for the next 12 hours. Your labs and x-rays are normal Return if you are worse at any time, especially worsening pain or inability to tolerate fluids

## 2018-01-21 NOTE — Code Documentation (Signed)
Code blue team called to [facility] entrance.  On arrival young patient lying on bench with OR staff around her - some vomiting into trash can.  She is alert hot and diaphoretic.  Neuro is intact - she states she was visiting a neighbor upstairs - was leaving and in elevator became dizzy with nausea. No loss of pulse or consciousness per report.  She was able to stand to the wheelchair - strong pulse - resps rapid - she also states she has a history of "bad anxiety".  Denied current anxiety - no meds at home.  She then started to c/o chest pain under left breast - worse with resps.  Code team MD present - patient taken to ED with code team MD and RRT RN - tol well - handoff to triage at ED.  Felix PaciniFriend James with patient - calling family per patient.

## 2018-01-21 NOTE — ED Provider Notes (Signed)
[facility] Provider [facility]   CSN: [id number] Arrival date & time: 01/21/18  1310     History   Chief Complaint Chief Complaint  Patient presents with  . Anxiety  . Chest Pain    HPI Nancy Williamson is a 27 y.o. female.  HPI   27 year old female presents today stating that she became nauseated approximately 12 hours ago and has had nausea and vomiting with 3 episodes of vomiting.  She has having some chills but denies fever.  She has not had any diarrhea.  She states that she has had some discomfort in the upper abdomen that she associates with previous anxiety.  She states that she does not think that she is pregnant.  She denies any abnormal vaginal discharge, urinary tract infection symptoms, or cough.  She has a history of DVT associated with pregnancy but has not had any leg swelling or dyspnea recently. Past Medical History:  Diagnosis Date  . Anxiety   . DVT (deep vein thrombosis) in pregnancy (HCC) 8 years ago-- right leg    There are no active problems to display for this patient.   Past Surgical History:  Procedure Laterality Date  . EYE SURGERY    . TONSILLECTOMY      OB History    Gravida Para Term Preterm AB Living   1 1 1     1    SAB TAB Ectopic Multiple Live Births           1       Home Medications    Prior to Admission medications   Medication Sig Start Date End Date Taking? Authorizing Provider  acetaminophen (TYLENOL) 500 MG tablet Take 1 tablet (500 mg total) by mouth every 6 (six) hours as needed. 01/14/16   Dan HumphreysIrick, Michael, MD  amoxicillin-clavulanate (AUGMENTIN) 875-125 MG tablet Take 1 tablet by mouth every 12 (twelve) hours. 02/28/17   Teressa LowerPickering, Vrinda, NP  ibuprofen (ADVIL,MOTRIN) 600 MG tablet Take 1 tablet (600 mg total) by mouth every 6 (six) hours as needed. 01/14/16   Dan HumphreysIrick, Michael, MD  ondansetron (ZOFRAN) 4 MG tablet Take 1 tablet (4 mg total) by mouth every 6 (six) hours. 01/14/16   Dan HumphreysIrick,  Michael, MD    Family History No family history on file.  Social History Social History   Tobacco Use  . Smoking status: Current Every Day Smoker  . Smokeless tobacco: Never Used  Substance Use Topics  . Alcohol use: Yes    Comment: occasionaly  . Drug use: Yes    Types: Marijuana     Allergies   Patient has no known allergies.   Review of Systems Review of Systems  All other systems reviewed and are negative.    Physical Exam Updated Vital Signs BP 117/80 (BP Location: Right Arm)   Pulse (!) 106   Temp 98.5 F (36.9 C) (Oral)   Resp (!) 24   Ht 1.626 m (5\' 4" )   Wt 59 kg (130 lb)   SpO2 100%   BMI 22.31 kg/m   Physical Exam   ED Treatments / Results  Labs (all labs ordered are listed, but only abnormal results are displayed) Labs Reviewed  BASIC METABOLIC PANEL - Abnormal; Notable for the following components:      Result Value   CO2 21 (*)    All other components within normal limits  CBC - Abnormal; Notable for the following components:   Hemoglobin 10.9 (*)    HCT 32.6 (*)  MCV 75.1 (*)    MCH 25.1 (*)    Platelets 404 (*)    All other components within normal limits  URINALYSIS, ROUTINE W REFLEX MICROSCOPIC - Abnormal; Notable for the following components:   APPearance HAZY (*)    All other components within normal limits  LIPASE, BLOOD  HEPATIC FUNCTION PANEL  I-STAT TROPONIN, ED  I-STAT BETA HCG BLOOD, ED ([facility], [facility], [facility] ONLY)    EKG  EKG Interpretation  Date/Time:  Sunday January 21 2018 13:36:31 EST Ventricular Rate:  103 PR Interval:  94 QRS Duration: 78 QT Interval:  378 QTC Calculation: 495 R Axis:   65 Text Interpretation:  Sinus tachycardia with short PR Otherwise normal ECG Confirmed by Margarita Grizzle [phone]) on 01/21/2018 4:47:03 PM       Radiology Dg Chest 2 View  Result Date: 01/21/2018 CLINICAL DATA:  Chest pain EXAM: CHEST  2 VIEW COMPARISON:  01/19/2013 FINDINGS: Lungs are clear.  No pleural effusion or pneumothorax.  The heart is normal in size. Visualized osseous structures are within normal limits. IMPRESSION: Normal chest radiographs. Electronically Signed   By: Charline Bills M.D.   On: 01/21/2018 14:20    Procedures Procedures (including critical care time)  Medications Ordered in ED Medications  ondansetron (ZOFRAN-ODT) disintegrating tablet 8 mg (not administered)  sodium chloride 0.9 % bolus 1,000 mL (not administered)     Initial Impression / Assessment and Plan / ED Course  I have reviewed the triage vital signs and the nursing notes.  Pertinent labs & imaging results that were available during my care of the patient were reviewed by me and considered in my medical decision making (see chart for details).     Patient with nausea and vomiting with lightheadedness. Here with normal labs and pe continue complaints of nonspecific nausea and chest pain.  EKG normal, trop normal, h/o dvt with pregnancy and d-dimer negative.  Zofran and fluids ordered, but not given- nursing reports refused, patient states she did not refuse, medications re offered to patient .  Nursing states she continues to refuse.   Final Clinical Impressions(s) / ED Diagnoses   Final diagnoses:  Nausea and vomiting, intractability of vomiting not specified, unspecified vomiting type  Anxiety  Chest pain, unspecified type    ED Discharge Orders    None       Margarita Grizzle, MD 01/21/18 2109

## 2018-01-21 NOTE — ED Notes (Signed)
Charge RN responded to bedside as patient verbally aggressive - primary nurse Sherrilyn RistKari at bedside, pt calmed and charge no longer needed at this time. Will remain alert to patient's behavioral needs.

## 2018-01-21 NOTE — ED Notes (Signed)
Pt and family member unhappy about being in hallway bed. This NT explained to pt that we were a full dept and this allowed her to see the provider quicker. Pt given blanket to use a pillow and an additional blanket for comfort.

## 2018-01-21 NOTE — ED Notes (Signed)
ED Provider at bedside. 

## 2018-01-21 NOTE — Code Documentation (Signed)
CODE BLUE NOTE  Patient Name: Nancy Williamson   MRN: [id number]   Date of Birth/ Sex: 09/29/1991 , female      Admission Date: (Not on file)  Attending Provider: No att. providers found  Primary Diagnosis: <principal problem not specified>    Indication: Pt was in her usual state of health until this PM while visiting friend in hospital, when she was noted to be "hot" and had nausea and vomiting. Code blue was subsequently called. At the time of arrival on scene, patient was awake and alert and speaking in full sentences.   Technical Description:  - CPR performance duration:  0 minute  - Was defibrillation or cardioversion used? No   - Was external pacer placed? No  - Was patient intubated pre/post CPR? No    Medications Administered: Y = Yes; Blank = No Amiodarone    Atropine    Calcium    Epinephrine    Lidocaine    Magnesium    Norepinephrine    Phenylephrine    Sodium bicarbonate    Vasopressin      Post CPR evaluation:  - Final Status - Was patient successfully resuscitated ? Yes - What is current hemodynamic status? Stable    Miscellaneous Information:  - Labs sent, including: None  - Primary team notified?  Patient not actively admitted, I personally walked with patient down to ED to have admission   - Family Notified? Friend with patient and notified   - Additional notes/ transfer status: Patient did not lose pulse or respiration. Patient had no witnessed episodes of emesis. Hx of anxiety and felt hot and nauseas. Endorsed chest pain but when asked to point to area she points below her breasts at rib margins.         Oralia ManisAbraham, Naomii Kreger, DO, PGY-1 [facility]  01/21/2018, 2:27 PM

## 2018-01-21 NOTE — ED Notes (Addendum)
Pt given water, tolerating well.   Pt also has a ginger-ale that she is tolerating.

## 2018-01-21 NOTE — ED Notes (Signed)
Upon multiple trauma patients arriving in the ED, patient and family began stating "they ain't even given me my medication yet. What is this kinda bullshit. I've been here 12 hours. I mean damn. I'm just gonna leave. Nobody has even said anything to me." Despite multiple people explaining delay to patient and family and ensuring we will be with them as soon as possible, patient and family continued to complain.

## 2018-01-21 NOTE — ED Notes (Signed)
Pt screaming at this RN hysterically after drawing blood saying "I can't take any kind of pain".  Afterwards, pt begins making phone calls, laughing and talking.

## 2018-01-21 NOTE — ED Triage Notes (Signed)
PT states she was on 4N visiting someone and she suddenly became dizzy, cp, nausea, and hot. PT arching back in triage chair and not holding still. Pt taking phone calls in triage and then when she gets off the phone returns to screaming out in pain. Pt states the light is killing her and rolling eyes at this RN when instructed to take slow deep breaths.

## 2018-01-21 NOTE — ED Notes (Addendum)
Pt verbally aggressive and pacing in the hallway at this time. Prior to Dr. Rosalia Hammersay re-assessing patient, patient was sleeping comfortably. Per Arline Aspindy, RN, patient refused nausea medication. Patient then states "I'm just gonna leave now." When patient was directed to the lobby, pt then became angry and sat in a hallway chair and began raising her voice. Reita ClicheBobby, RN offered patient zofran, which patient refused, then proceeded to explain to patient that she would need to go back to her room or to the waiting room but she could not sit in the hallway. Patient then became more aggressive and yelled "I need to speak with someone. Jerrye NobleY'all is so rude, I've been here for 12 hours and nobody has done NOTHIN." Sherrilyn RistKari RN speaking with patient at this time. Consulting civil engineerCharge RN notified

## 2019-09-20 ENCOUNTER — Emergency Department (HOSPITAL_COMMUNITY)
Admission: EM | Admit: 2019-09-20 | Discharge: 2019-09-20 | Disposition: A | Discharge status: Home / Self Care | Payer: Self-pay | Attending: Emergency Medicine | Admitting: Emergency Medicine

## 2019-09-20 ENCOUNTER — Encounter (HOSPITAL_COMMUNITY): Payer: Self-pay | Admitting: *Deleted

## 2019-09-20 ENCOUNTER — Other Ambulatory Visit: Payer: Self-pay

## 2019-09-20 DIAGNOSIS — Z20822 Contact with and (suspected) exposure to covid-19: Secondary | ICD-10-CM

## 2019-09-20 DIAGNOSIS — Z20828 Contact with and (suspected) exposure to other viral communicable diseases: Secondary | ICD-10-CM | POA: Insufficient documentation

## 2019-09-20 DIAGNOSIS — F17200 Nicotine dependence, unspecified, uncomplicated: Secondary | ICD-10-CM | POA: Insufficient documentation

## 2019-09-20 DIAGNOSIS — J029 Acute pharyngitis, unspecified: Secondary | ICD-10-CM | POA: Insufficient documentation

## 2019-09-20 LAB — POC URINE PREG, ED: Preg Test, Ur: NEGATIVE

## 2019-09-20 LAB — GROUP A STREP BY PCR: Group A Strep by PCR: NOT DETECTED

## 2019-09-20 NOTE — ED Triage Notes (Signed)
C/o sore throat onset 2 days ago

## 2019-09-20 NOTE — ED Notes (Addendum)
Pt did not tolerate covid swab procedure well, sample obtained with difficulty.

## 2019-09-20 NOTE — Discharge Instructions (Addendum)
You have been diagnosed today with viral pharyngitis, suspected COVID-19 virus infection.  At this time there does not appear to be the presence of an emergent medical condition, however there is always the potential for conditions to change. Please read and follow the below instructions.  Please return to the Emergency Department immediately for any new or worsening symptoms or if your symptoms do not improve within 3 days. Please be sure to follow up with your Primary Care Provider within one week regarding your visit today; please call their office to schedule an appointment even if you are feeling better for a follow-up visit. You have been tested for the COVID-19 virus today.  Your results will be available on your MyChart account within the next 1-2 days.  Please discuss your results with your primary care provider via telephone.  Please continue your self quarantine and use of mask to avoid potential spread of any virus.  Even if your Covid test is negative I strongly advised that he continue self quarantining to prevent spread of any virus and in case there is a false negative test today.  Self isolate until symptom-free for 7 full days.  Return to the emergency department for any new or worsening symptoms. You may use the viscous lidocaine mouth rinse as prescribed to help with your sore throat, do not swallow viscous lidocaine.  You may also use over-the-counter anti-inflammatories and throat sprays to help with your symptoms.  Get help right away if: You have trouble breathing. You cannot swallow fluids, soft foods, or your saliva. You have swelling in your throat or neck that gets worse. You keep feeling sick to your stomach (nauseous). You keep throwing up (vomiting). You have chest pain You have any new/concerning or worsening of symptoms.  Please read the additional information packets attached to your discharge summary.  Do not take your medicine if  develop an itchy rash,  swelling in your mouth or lips, or difficulty breathing; call 911 and seek immediate emergency medical attention if this occurs.  Note: Portions of this text may have been transcribed using voice recognition software. Every effort was made to ensure accuracy; however, inadvertent computerized transcription errors may still be present.

## 2019-09-20 NOTE — ED Notes (Signed)
Patient verbalizes understanding of discharge instructions. Opportunity for questioning and answers were provided. Armband removed by staff, pt discharged from ED to home via pov 

## 2019-09-20 NOTE — ED Notes (Signed)
Pt works at [employer], no known covid contacts.  Reports emesis x1 today, sore throat x2 days, body aches  And neck stiffness x2 days. Denies sob.

## 2019-09-20 NOTE — ED Provider Notes (Signed)
[facility] EMERGENCY DEPARTMENT Provider Note   CSN: [id number] Arrival date & time: 09/20/19  1345     History   Chief Complaint Chief Complaint  Patient presents with  . Sore Throat    HPI Nancy Williamson is a 28 y.o. female without pertinent past medical history presents today for viral illness x2 days.  Patient reports that she developed sore throat, body aches and fatigue 2 days ago.  She describes sore throat as a bilateral scratchy sensation moderate in intensity constant without aggravating or alleviating factors or radiation.  She has not tried any medication for symptoms prior to arrival.  She describes body aches as a mild achy sensation to all of her muscles and joints without focal pain.  She describes fatigue as a tired-like feeling.  She reports that her employer sent her in today for evaluation and work note.  Patient denies fever/chills, headache/vision changes, neck pain, drooling, voice change, swelling the face/head/neck, chest pain/shortness of breath, cough, abdominal pain, nausea/vomiting, diarrhea, dysuria/hematuria, fall/injury or any additional concerns.  Patient denies any known sick contacts.    HPI  Past Medical History:  Diagnosis Date  . Anxiety   . DVT (deep vein thrombosis) in pregnancy 8 years ago-- right leg    There are no active problems to display for this patient.   Past Surgical History:  Procedure Laterality Date  . EYE SURGERY    . TONSILLECTOMY       OB History    Gravida  1   Para  1   Term  1   Preterm      AB      Living  1     SAB      TAB      Ectopic      Multiple      Live Births  1            Home Medications    Prior to Admission medications   Medication Sig Start Date End Date Taking? Authorizing Provider  acetaminophen (TYLENOL) 500 MG tablet Take 1 tablet (500 mg total) by mouth every 6 (six) hours as needed. 01/14/16   Dan Humphreys, MD  amoxicillin-clavulanate  (AUGMENTIN) 875-125 MG tablet Take 1 tablet by mouth every 12 (twelve) hours. 02/28/17   Teressa Lower, NP  ibuprofen (ADVIL,MOTRIN) 600 MG tablet Take 1 tablet (600 mg total) by mouth every 6 (six) hours as needed. 01/14/16   Dan Humphreys, MD  ondansetron (ZOFRAN ODT) 8 MG disintegrating tablet Take 1 tablet (8 mg total) by mouth every 8 (eight) hours as needed for nausea or vomiting. 01/21/18   Margarita Grizzle, MD  ondansetron (ZOFRAN) 4 MG tablet Take 1 tablet (4 mg total) by mouth every 6 (six) hours. 01/14/16   Dan Humphreys, MD    Family History No family history on file.  Social History Social History   Tobacco Use  . Smoking status: Current Every Day Smoker  . Smokeless tobacco: Never Used  Substance Use Topics  . Alcohol use: Yes    Comment: occasionaly  . Drug use: Yes    Types: Marijuana     Allergies   Patient has no known allergies.   Review of Systems Review of Systems Ten systems are reviewed and are negative for acute change except as noted in the HPI   Physical Exam Updated Vital Signs BP (!) 139/100 (BP Location: Left Arm)   Pulse 88   Temp 98.7 F (37.1 C) (Oral)  Resp 16   Ht 5\' 4"  (1.626 m)   Wt 59.9 kg   SpO2 99%   BMI 22.66 kg/m   Physical Exam Constitutional:      General: She is not in acute distress.    Appearance: Normal appearance. She is well-developed. She is not ill-appearing or diaphoretic.  HENT:     Head: Normocephalic and atraumatic. No raccoon eyes or Battle's sign.     Jaw: There is normal jaw occlusion. No trismus.     Right Ear: External ear normal.     Left Ear: External ear normal.     Nose: Nose normal.     Mouth/Throat:     Mouth: Mucous membranes are moist.     Pharynx: Oropharynx is clear. Posterior oropharyngeal erythema present. No pharyngeal swelling or uvula swelling.     Tonsils: No tonsillar exudate or tonsillar abscesses. 1+ on the right. 1+ on the left.     Comments: The patient has normal phonation and is  in control of secretions. No stridor.  Midline uvula without edema. Soft palate rises symmetrically.  Mild bilateral tonsillar erythema, tonsils 1+ equal bilaterally without exudate. Tongue protrusion is normal, floor of mouth is soft. No trismus. No creptius on neck palpation. No gingival erythema or fluctuance noted. Mucus membranes moist. Eyes:     General: Vision grossly intact. Gaze aligned appropriately.     Extraocular Movements: Extraocular movements intact.     Conjunctiva/sclera: Conjunctivae normal.     Pupils: Pupils are equal, round, and reactive to light.  Neck:     Musculoskeletal: Full passive range of motion without pain, normal range of motion and neck supple. No crepitus.     Trachea: Trachea and phonation normal. No tracheal deviation.     Meningeal: Brudzinski's sign absent.  Cardiovascular:     Rate and Rhythm: Normal rate and regular rhythm.     Heart sounds: Normal heart sounds.  Pulmonary:     Effort: Pulmonary effort is normal. No accessory muscle usage or respiratory distress.     Breath sounds: Normal breath sounds and air entry.  Abdominal:     General: There is no distension.     Palpations: Abdomen is soft.     Tenderness: There is no abdominal tenderness. There is no guarding or rebound.  Musculoskeletal: Normal range of motion.     Right lower leg: Normal.     Left lower leg: Normal.  Skin:    General: Skin is warm and dry.  Neurological:     Mental Status: She is alert.     GCS: GCS eye subscore is 4. GCS verbal subscore is 5. GCS motor subscore is 6.     Comments: Speech is clear and goal oriented, follows commands Major Cranial nerves without deficit, no facial droop Normal strength in upper and lower extremities bilaterally including dorsiflexion and plantar flexion, strong and equal grip strength Sensation normal to light and sharp touch Moves extremities without ataxia, coordination intact Normal gait  Psychiatric:        Behavior: Behavior  normal.    ED Treatments / Results  Labs (all labs ordered are listed, but only abnormal results are displayed) Labs Reviewed  GROUP A STREP BY PCR  NOVEL CORONAVIRUS, NAA (HOSP ORDER, SEND-OUT TO REF LAB; TAT 18-24 HRS)  POC URINE PREG, ED    EKG None  Radiology No results found.  Procedures Procedures (including critical care time)  Medications Ordered in ED Medications - No data to display  Initial Impression / Assessment and Plan / ED Course  I have reviewed the triage vital signs and the nursing notes.  Pertinent labs & imaging results that were available during my care of the patient were reviewed by me and considered in my medical decision making (see chart for details).    Nancy Williamson is a 28 y.o. female who presents today a 2-day history of viral illness with sore throat, body aches and fatigue.  On examination patient is overall well-appearing and in no acute distress requesting a work note. Airway is patient, patient reports normal phonation and clinically patient does not sound muffled. Tonsils appear symmetric with minimal erythema and without exudate and their is no uvula deviation. No trismus on examination. Floor of the mouth is soft/nontender and their is no abnormal tongue protrusion. Patient is tolerating their own secretions without drooling and patient is able to drink here in the emergency department. Examination of the neck reveals mild cervical lymphadenopathy.  No meningeal signs.  Abdominal exam reveals a soft/nontender abdomen without palpable LUQ mass or tenderness.  Strep test negative Urine pregnancy test negative  Suspect that patient's sore throat due to viral pharyngitis.  No evidence of bacterial infection as etiology of symptoms today. No signs of Peritonsillar Abscess, Ludwig's Angina, Retropharyngeal Abscess, Dental Abscess or other deep tissue infections of the head or neck. Additionally doubt mononucleosis, gonococcal pharyngitis,  foreign body or epiglottitis at this time.  Viscous lidocaine rinse prescribed for comfort. No antibiotics indicated at this time.Patient does not appear dehydrated, but did discuss importance of water rehydration. Patient counseled on conservative home remedies for sore throat.  With ongoing COVID-19 pandemic patient will be tested with a send out test today.  She is aware to follow-up on her results on her MyChart account.  I have advised that despite result of this test that she should continue self quarantining until she is symptom-free for at least 7 days to avoid any potential spread of a virus and in case of false negative test.  Patient will follow up with her PCP regarding results and return to the ER for any new or worsening of symptoms.  There is no indication for any further imaging or testing at this time.  At this time there does not appear to be any evidence of an acute emergency medical condition and the patient appears stable for discharge with appropriate outpatient follow up. Diagnosis was discussed with patient who verbalizes understanding of care plan and is agreeable to discharge. I have discussed return precautions with patient who verbalizes understanding of return precautions. Patient encouraged to follow-up with their PCP. All questions answered.  Suanne Kuri was evaluated in Emergency Department on 09/20/2019 for the symptoms described in the history of present illness. She was evaluated in the context of the global COVID-19 pandemic, which necessitated consideration that the patient might be at risk for infection with the SARS-CoV-2 virus that causes COVID-19. Institutional protocols and algorithms that pertain to the evaluation of patients at risk for COVID-19 are in a state of rapid change based on information released by regulatory bodies including the CDC and federal and state organizations. These policies and algorithms were followed during the patient's care in the ED.   Note: Portions of this report may have been transcribed using voice recognition software. Every effort was made to ensure accuracy; however, inadvertent computerized transcription errors may still be present. Final Clinical Impressions(s) / ED Diagnoses   Final diagnoses:  Viral pharyngitis  Suspected COVID-19 virus  infection    ED Discharge Orders    None       Elizabeth PalauMorelli, Lennin Osmond A, PA-C 09/20/19 1816    Little, Ambrose Finlandachel Morgan, MD 09/23/19 [id number]

## 2019-09-22 LAB — NOVEL CORONAVIRUS, NAA (HOSP ORDER, SEND-OUT TO REF LAB; TAT 18-24 HRS): SARS-CoV-2, NAA: NOT DETECTED

## 2019-09-23 ENCOUNTER — Encounter (HOSPITAL_COMMUNITY): Payer: Self-pay | Admitting: Emergency Medicine

## 2019-09-23 ENCOUNTER — Other Ambulatory Visit: Payer: Self-pay

## 2019-09-23 ENCOUNTER — Emergency Department (HOSPITAL_COMMUNITY)
Admission: EM | Admit: 2019-09-23 | Discharge: 2019-09-23 | Disposition: A | Discharge status: Home / Self Care | Payer: Self-pay | Attending: Emergency Medicine | Admitting: Emergency Medicine

## 2019-09-23 DIAGNOSIS — H66001 Acute suppurative otitis media without spontaneous rupture of ear drum, right ear: Secondary | ICD-10-CM | POA: Insufficient documentation

## 2019-09-23 DIAGNOSIS — F17210 Nicotine dependence, cigarettes, uncomplicated: Secondary | ICD-10-CM | POA: Insufficient documentation

## 2019-09-23 DIAGNOSIS — F1210 Cannabis abuse, uncomplicated: Secondary | ICD-10-CM | POA: Insufficient documentation

## 2019-09-23 MED ORDER — AMOXICILLIN-POT CLAVULANATE 875-125 MG PO TABS
1.0000 | ORAL_TABLET | Freq: Once | ORAL | Status: AC
  Administered 2019-09-23: 05:00:00 1 via ORAL
  Filled 2019-09-23: qty 1

## 2019-09-23 MED ORDER — KETOROLAC TROMETHAMINE 60 MG/2ML IM SOLN
30.0000 mg | Freq: Once | INTRAMUSCULAR | Status: AC
  Administered 2019-09-23: 05:00:00 30 mg via INTRAMUSCULAR
  Filled 2019-09-23: qty 2

## 2019-09-23 MED ORDER — AMOXICILLIN-POT CLAVULANATE 875-125 MG PO TABS: 1.0000 | ORAL_TABLET | Freq: Two times a day (BID) | ORAL | 0 refills | Status: AC

## 2019-09-23 NOTE — Discharge Instructions (Signed)
Thank you for allowing me to care for you today in the Emergency Department.   Take 1 tablet of Augmentin 2 times daily for the next 10 days.  Your first dose was given in the ER.  Take 650 mg of Tylenol or 600 mg of ibuprofen with food every 6 hours for pain.  You can alternate between these 2 medications every 3 hours if your pain returns.  For instance, you can take Tylenol at noon, followed by a dose of ibuprofen at 3, followed by second dose of Tylenol and 6.  Return to the emergency department if you develop bloody discharge from the ear, if you become unable to turn your neck, high fevers that persist despite taking ibuprofen and Tylenol as described above, changes in your vision, or other new, concerning symptoms.

## 2019-09-23 NOTE — ED Triage Notes (Signed)
Pt c/o R earache since yesterday, pt was seen 2 days ago for sore throat, body aches. covid neg.

## 2019-09-23 NOTE — ED Provider Notes (Signed)
[facility] EMERGENCY DEPARTMENT Provider Note   CSN: [id number] Arrival date & time: 09/23/19  0426     History   Chief Complaint Chief Complaint  Patient presents with   Otalgia    HPI Nancy Williamson is a 28 y.o. female with a history of anxiety and DVT in pregnancy who presents to the emergency department with a chief complaint of right otalgia.  The patient reports that she was seen 2 days ago in the ER for sore throat and body aches.  She had a Covid test, which was negative.  She reports the sore throat and body aches have since resolved.  She developed right ear pain 24 hours ago.  She reports that her hearing has been decreased on the right side.  States she heard a loud "pop", but denies otorrhea.  She denies fever, chills, neck pain or stiffness, visual changes, headache, shortness of breath, congestion, rhinorrhea, or cough.  She treated her symptoms with 400 mg of Advil at 1900.  She has not established with a primary care provider.  She denies any concerns for pregnancy.     The history is provided by the patient. No language interpreter was used.    Past Medical History:  Diagnosis Date   Anxiety    DVT (deep vein thrombosis) in pregnancy 8 years ago-- right leg    There are no active problems to display for this patient.   Past Surgical History:  Procedure Laterality Date   EYE SURGERY     TONSILLECTOMY       OB History    Gravida  1   Para  1   Term  1   Preterm      AB      Living  1     SAB      TAB      Ectopic      Multiple      Live Births  1            Home Medications    Prior to Admission medications   Medication Sig Start Date End Date Taking? Authorizing Provider  acetaminophen (TYLENOL) 500 MG tablet Take 1 tablet (500 mg total) by mouth every 6 (six) hours as needed. 01/14/16   Dan Humphreys, MD  amoxicillin-clavulanate (AUGMENTIN) 875-125 MG tablet Take 1 tablet by mouth every 12  (twelve) hours for 10 days. 09/23/19 10/03/19  Disha Cottam A, PA-C  ibuprofen (ADVIL,MOTRIN) 600 MG tablet Take 1 tablet (600 mg total) by mouth every 6 (six) hours as needed. 01/14/16   Dan Humphreys, MD  ondansetron (ZOFRAN ODT) 8 MG disintegrating tablet Take 1 tablet (8 mg total) by mouth every 8 (eight) hours as needed for nausea or vomiting. 01/21/18   Margarita Grizzle, MD  ondansetron (ZOFRAN) 4 MG tablet Take 1 tablet (4 mg total) by mouth every 6 (six) hours. 01/14/16   Dan Humphreys, MD    Family History No family history on file.  Social History Social History   Tobacco Use   Smoking status: Current Every Day Smoker   Smokeless tobacco: Never Used  Substance Use Topics   Alcohol use: Yes    Comment: occasionaly   Drug use: Yes    Types: Marijuana     Allergies   Patient has no known allergies.   Review of Systems Review of Systems  Constitutional: Negative for activity change, chills and fever.  HENT: Positive for ear pain. Negative for congestion, ear discharge, facial  swelling, nosebleeds, postnasal drip, sinus pressure, sinus pain, sore throat and tinnitus.   Respiratory: Negative for shortness of breath.   Cardiovascular: Negative for chest pain.  Gastrointestinal: Negative for abdominal pain, diarrhea, nausea and vomiting.  Genitourinary: Negative for dysuria.  Musculoskeletal: Negative for back pain.  Skin: Negative for rash.  Allergic/Immunologic: Negative for immunocompromised state.  Neurological: Negative for headaches.  Psychiatric/Behavioral: Negative for confusion.     Physical Exam Updated Vital Signs BP (!) 154/104 (BP Location: Left Arm)    Pulse (!) 110    Temp 98.3 F (36.8 C) (Oral)    Resp 16    Ht 5\' 4"  (1.626 m)    Wt 60 kg    LMP 09/05/2019    SpO2 99%    BMI 22.71 kg/m   Physical Exam Vitals signs and nursing note reviewed.  Constitutional:      General: She is not in acute distress.    Comments: Tearful and holding her right ear   HENT:     Head: Normocephalic.     Right Ear: Tenderness present. No drainage or swelling. No mastoid tenderness. Tympanic membrane is injected and erythematous.     Left Ear: Hearing, tympanic membrane and ear canal normal. No drainage or swelling. No mastoid tenderness. Tympanic membrane is not injected or erythematous.     Ears:     Comments: Some erythema noted at the posterior canal. No swelling of the canal. No auricular lymphadenopathy.  No redness, warmth, or swelling to the mastoid. No focal mastoid tenderness.     Nose: Nose normal.     Right Sinus: No maxillary sinus tenderness or frontal sinus tenderness.     Left Sinus: No maxillary sinus tenderness or frontal sinus tenderness.     Mouth/Throat:     Pharynx: Oropharynx is clear. Uvula midline. No pharyngeal swelling, oropharyngeal exudate, posterior oropharyngeal erythema or uvula swelling.  Eyes:     Conjunctiva/sclera: Conjunctivae normal.  Neck:     Musculoskeletal: Neck supple.     Comments: No meningismus Cardiovascular:     Rate and Rhythm: Normal rate and regular rhythm.     Pulses: Normal pulses.     Heart sounds: Normal heart sounds. No murmur. No friction rub. No gallop.   Pulmonary:     Effort: Pulmonary effort is normal. No respiratory distress.     Breath sounds: No stridor. No wheezing, rhonchi or rales.  Chest:     Chest wall: No tenderness.  Abdominal:     General: There is no distension.     Palpations: Abdomen is soft.  Skin:    General: Skin is warm.     Findings: No rash.  Neurological:     Mental Status: She is alert.  Psychiatric:        Behavior: Behavior normal.      ED Treatments / Results  Labs (all labs ordered are listed, but only abnormal results are displayed) Labs Reviewed - No data to display  EKG None  Radiology No results found.  Procedures Procedures (including critical care time)  Medications Ordered in ED Medications  ketorolac (TORADOL) injection 30 mg (30 mg  Intramuscular Given 09/23/19 0501)  amoxicillin-clavulanate (AUGMENTIN) 875-125 MG per tablet 1 tablet (1 tablet Oral Given 09/23/19 0502)     Initial Impression / Assessment and Plan / ED Course  I have reviewed the triage vital signs and the nursing notes.  Pertinent labs & imaging results that were available during my care of the patient  were reviewed by me and considered in my medical decision making (see chart for details).        28 year old female with a history of anxiety and DVT in pregnancy who presents to the emergency department with right ear pain for the last 24 hours.  No constitutional symptoms.  She is afebrile in the ER.  She had a sore throat 48 hours ago and body aches, but these have since resolved.  She tested negative for COVID-19 and for streptococcal pharyngitis.  On exam, the patient has significant erythema noted to the right TM.  Right TM is injected.  No mastoid tenderness, or redness.  Will treat with Augmentin for right AOM.  Doubt mastoiditis, malignant otitis externa, or meningitis.  She was given Toradol in the ER for pain control and her first dose of Augmentin.  Given the severity of her symptoms, we will send her home with a 10-day course.  She has been given a referral to ENT if symptoms do not significantly improve in the next 4 to 5 days.  ER return precautions given.  She is hemodynamically stable in no acute distress.  Safe for discharge to home with outpatient follow-up as indicated.   Final Clinical Impressions(s) / ED Diagnoses   Final diagnoses:  Non-recurrent acute suppurative otitis media of right ear without spontaneous rupture of tympanic membrane    ED Discharge Orders         Ordered    amoxicillin-clavulanate (AUGMENTIN) 875-125 MG tablet  Every 12 hours     09/23/19 0455           Joanne Gavel, PA-C 09/23/19 8366    Orpah Greek, MD 09/23/19 [phone]

## 2020-08-11 ENCOUNTER — Other Ambulatory Visit: Payer: Self-pay

## 2020-08-11 DIAGNOSIS — Z20822 Contact with and (suspected) exposure to covid-19: Secondary | ICD-10-CM

## 2020-08-13 LAB — SARS-COV-2, NAA 2 DAY TAT

## 2020-08-13 LAB — NOVEL CORONAVIRUS, NAA: SARS-CoV-2, NAA: NOT DETECTED

## 2022-02-12 ENCOUNTER — Other Ambulatory Visit: Payer: Self-pay

## 2022-02-12 ENCOUNTER — Ambulatory Visit (HOSPITAL_COMMUNITY)
Admission: EM | Admit: 2022-02-12 | Discharge: 2022-02-12 | Disposition: A | Discharge status: Home / Self Care | Payer: Self-pay | Attending: Family Medicine | Admitting: Family Medicine

## 2022-02-12 ENCOUNTER — Encounter (HOSPITAL_COMMUNITY): Payer: Self-pay

## 2022-02-12 DIAGNOSIS — H66002 Acute suppurative otitis media without spontaneous rupture of ear drum, left ear: Secondary | ICD-10-CM

## 2022-02-12 MED ORDER — IBUPROFEN 800 MG PO TABS: 800.0000 mg | ORAL_TABLET | Freq: Three times a day (TID) | ORAL | 0 refills | Status: DC

## 2022-02-12 MED ORDER — AMOXICILLIN 875 MG PO TABS: 875.0000 mg | ORAL_TABLET | Freq: Two times a day (BID) | ORAL | 0 refills | Status: AC

## 2022-02-12 NOTE — ED Triage Notes (Signed)
Pt presents for left ear pain x 2 days.  

## 2022-02-12 NOTE — ED Provider Notes (Signed)
?  [place] ? ? ?[id number] ?02/12/22 Arrival Time: [id number] ? ?ASSESSMENT & PLAN: ? ?1. Non-recurrent acute suppurative otitis media of left ear without spontaneous rupture of tympanic membrane   ? ?No signs of TM rupture. ?OTC symptom care as needed. ?Begin: ?Discharge Medication List as of 02/12/2022  1:21 PM  ?  ? ?START taking these medications  ? Details  ?amoxicillin (AMOXIL) 875 MG tablet Take 1 tablet (875 mg total) by mouth 2 (two) times daily for 7 days., Starting Sat 02/12/2022, Until Sat 02/19/2022, Normal  ?  ?  ? ? ? Follow-up Information   ? ? [facility].   ?Specialty: Urgent Care ?Why: If worsening or failing to improve as anticipated. ?Contact information: ?[address] ?[place] SSN-[id number] ?[phone] ? ?  ?  ? ?  ?  ? ?  ? ? ?Reviewed expectations re: course of current medical issues. Questions answered. ?Outlined signs and symptoms indicating need for more acute intervention. ?Understanding verbalized. ?After Visit Summary given. ? ? ?SUBJECTIVE: ?History from: Patient. ?Nancy Williamson is a 31 y.o. female. Reports: LEFT ear pain; abrupt onset; x 2 days. Recent congestion from seasonal allergies. Denies: fever. Normal PO intake without n/v/d. ? ?OBJECTIVE: ? ?Vitals:  ? 02/12/22 1312 02/12/22 1315  ?BP:  (!) 138/94  ?Resp: 16   ?Temp: 97.8 ?F (36.6 ?C)   ?TempSrc: Oral   ?SpO2: 100%   ?  ?General appearance: alert; no distress ?Eyes: PERRLA; EOMI; conjunctiva normal ?HENT: [place]; AT; with nasal congestion; L TM with erythema and bulging ?Neck: supple  ?Lungs: speaks full sentences without difficulty; unlabored ?Extremities: no edema ?Skin: warm and dry ?Neurologic: normal gait ?Psychological: alert and cooperative; normal mood and affect ? ? ?No Known Allergies ? ?Past Medical History:  ?Diagnosis Date  ? Anxiety   ? DVT (deep vein thrombosis) in pregnancy 8 years ago-- right leg  ? ?Social History  ? ?Socioeconomic History  ? Marital status:  Single  ?  Spouse name: Not on file  ? Number of children: Not on file  ? Years of education: Not on file  ? Highest education level: Not on file  ?Occupational History  ? Not on file  ?Tobacco Use  ? Smoking status: Every Day  ? Smokeless tobacco: Never  ?Substance and Sexual Activity  ? Alcohol use: Yes  ?  Comment: occasionaly  ? Drug use: Yes  ?  Types: Marijuana  ? Sexual activity: Yes  ?  Birth control/protection: None  ?Other Topics Concern  ? Not on file  ?Social History Narrative  ? Not on file  ? ?Social Determinants of Health  ? ?Financial Resource Strain: Not on file  ?Food Insecurity: Not on file  ?Transportation Needs: Not on file  ?Physical Activity: Not on file  ?Stress: Not on file  ?Social Connections: Not on file  ?Intimate Partner Violence: Not on file  ? ?History reviewed. No pertinent family history. ?Past Surgical History:  ?Procedure Laterality Date  ? EYE SURGERY    ? TONSILLECTOMY    ? ?  ?Vanessa Kick, MD ?02/12/22 1346 ? ?

## 2023-01-28 ENCOUNTER — Ambulatory Visit (HOSPITAL_COMMUNITY)
Admission: EM | Admit: 2023-01-28 | Discharge: 2023-01-28 | Disposition: A | Discharge status: Home / Self Care | Payer: Self-pay | Attending: Physician Assistant | Admitting: Physician Assistant

## 2023-01-28 ENCOUNTER — Encounter (HOSPITAL_COMMUNITY): Payer: Self-pay

## 2023-01-28 ENCOUNTER — Ambulatory Visit (INDEPENDENT_AMBULATORY_CARE_PROVIDER_SITE_OTHER): Payer: Self-pay

## 2023-01-28 DIAGNOSIS — M79642 Pain in left hand: Secondary | ICD-10-CM

## 2023-01-28 DIAGNOSIS — M778 Other enthesopathies, not elsewhere classified: Secondary | ICD-10-CM

## 2023-01-28 DIAGNOSIS — M25532 Pain in left wrist: Secondary | ICD-10-CM

## 2023-01-28 DIAGNOSIS — M67432 Ganglion, left wrist: Secondary | ICD-10-CM

## 2023-01-28 MED ORDER — IBUPROFEN 800 MG PO TABS
800.0000 mg | ORAL_TABLET | Freq: Once | ORAL | Status: AC
  Administered 2023-01-28: 800 mg via ORAL

## 2023-01-28 MED ORDER — IBUPROFEN 800 MG PO TABS
ORAL_TABLET | ORAL | Status: AC
  Filled 2023-01-28: qty 1

## 2023-01-28 MED ORDER — NAPROXEN SODIUM 550 MG PO TABS: 550.0000 mg | ORAL_TABLET | Freq: Two times a day (BID) | ORAL | 0 refills | Status: AC

## 2023-01-28 NOTE — ED Triage Notes (Signed)
Patient  c/o left wrist pain and has a raised area present x 1 month. Patient states the pain radiates into her left hand. Patient denies any injury.  Patient denies taking any medication for pain.

## 2023-01-28 NOTE — ED Provider Notes (Signed)
[place]    CSN: [id number] Arrival date & time: 01/28/23  1114      History   Chief Complaint Chief Complaint  Patient presents with   Wrist Pain    HPI Nancy Williamson is a 32 y.o. female.   32 year old female presents with left wrist and hand pain.  She indicates for the past months she has had a raised area on the anterior portion of the left wrist.  She indicates that this area has become tender and sore.  Patient indicates for the past week she has been having progressive left hand pain, swelling, tenderness with limited range of motion.  She indicates the pain is located at the thenar area of the palm.  She indicates the pain is worse with movement, limited flexion extension of the hand due to pain and discomfort.  She indicates she did not do anything to injure her hand that she is aware of.  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  She indicates she has been taking Tylenol OTC without pain relief.   Wrist Pain    Past Medical History:  Diagnosis Date   Anxiety    DVT (deep vein thrombosis) in pregnancy 8 years ago-- right leg    There are no problems to display for this patient.   Past Surgical History:  Procedure Laterality Date   EYE SURGERY     TONSILLECTOMY      OB History     Gravida  1   Para  1   Term  1   Preterm      AB      Living  1      SAB      IAB      Ectopic      Multiple      Live Births  1            Home Medications    Prior to Admission medications   Medication Sig Start Date End Date Taking? Authorizing Provider  naproxen sodium (ANAPROX DS) 550 MG tablet Take 1 tablet (550 mg total) by mouth 2 (two) times daily with a meal. 01/28/23  Yes Nyoka Lint, PA-C  acetaminophen (TYLENOL) 500 MG tablet Take 1 tablet (500 mg total) by mouth every 6 (six) hours as needed. 01/14/16   Vira Blanco, MD  ondansetron (ZOFRAN ODT) 8 MG disintegrating tablet Take 1 tablet  (8 mg total) by mouth every 8 (eight) hours as needed for nausea or vomiting. 01/21/18   Pattricia Boss, MD  ondansetron (ZOFRAN) 4 MG tablet Take 1 tablet (4 mg total) by mouth every 6 (six) hours. 01/14/16   Vira Blanco, MD    Family History Family History  Problem Relation Age of Onset   Diabetes Mother     Social History Social History   Tobacco Use   Smoking status: Every Day    Packs/day: 0.50    Types: Cigarettes   Smokeless tobacco: Never  Vaping Use   Vaping Use: Never used  Substance Use Topics   Alcohol use: Yes    Comment: occasionaly   Drug use: Yes    Types: Marijuana     Allergies   Patient has no known allergies.   Review of Systems Review of Systems  Musculoskeletal:  Positive for joint swelling (left wrist and thenar area.).     Physical Exam Triage Vital Signs ED Triage Vitals [01/28/23 1220]  Enc Vitals Group  BP (!) 163/110     Pulse Rate 90     Resp 16     Temp 98.7 F (37.1 C)     Temp Source Oral     SpO2 98 %     Weight      Height      Head Circumference      Peak Flow      Pain Score 10     Pain Loc      Pain Edu?      Excl. in [place]?    No data found.  Updated Vital Signs BP (!) 163/110 (BP Location: Right Arm)   Pulse 90   Temp 98.7 F (37.1 C) (Oral)   Resp 16   LMP 12/27/2022 (Approximate)   SpO2 98%   Visual Acuity Right Eye Distance:   Left Eye Distance:   Bilateral Distance:    Right Eye Near:   Left Eye Near:    Bilateral Near:     Physical Exam Constitutional:      Appearance: Normal appearance.  Musculoskeletal:       Arms:     Comments: Left wrist: There is a 2 cm x 1 cm slightly raised centrally located cystic area on the midportion of the wrist.  There is pain on palpation.  Pain is elicited with flexion, extension and rotation.  Left hand: Pain is palpated along the anterior thenar area with mild swelling, tenderness on palpation without any unusual redness or bruising, pain with flexion  extension and rotation.  Stability is normal.  Neurological:     Mental Status: She is alert.      UC Treatments / Results  Labs (all labs ordered are listed, but only abnormal results are displayed) Labs Reviewed - No data to display  EKG   Radiology DG Hand Complete Left  Result Date: 01/28/2023 CLINICAL DATA:  Pain near the thumb.  Cystic area in the mid wrist. EXAM: LEFT HAND - COMPLETE 3+ VIEW COMPARISON:  None Available. FINDINGS: There is no evidence of fracture or dislocation. There is no evidence of arthropathy or other focal bone abnormality. Soft tissues are unremarkable. IMPRESSION: Negative. Electronically Signed   By: Nolon Nations M.D.   On: 01/28/2023 13:14   DG Wrist Complete Left  Result Date: 01/28/2023 CLINICAL DATA:  Cystic area and near mid wrist.  Pain at the thumb. EXAM: LEFT WRIST - COMPLETE 3+ VIEW COMPARISON:  None Available. FINDINGS: There is no evidence of fracture or dislocation. There is no evidence of arthropathy or other focal bone abnormality. Soft tissues are unremarkable. IMPRESSION: Negative. Electronically Signed   By: Nolon Nations M.D.   On: 01/28/2023 13:13    Procedures Procedures (including critical care time)  Medications Ordered in UC Medications  ibuprofen (ADVIL) tablet 800 mg (800 mg Oral Given 01/28/23 1319)    Initial Impression / Assessment and Plan / UC Course  I have reviewed the triage vital signs and the nursing notes.  Pertinent labs & imaging results that were available during my care of the patient were reviewed by me and considered in my medical decision making (see chart for details).    Plan: The diagnosis to be treated with the following: 1.  Left wrist pain: A.  Anaprox DS 550 mg every 12 hours with food to help decrease pain. B.  Advised ice therapy, 5 minutes on 10 minutes off, 3-4 times throughout the day to help reduce pain or discomfort. C.  Wrist support to  help will help reduce pain and discomfort with  use. 2.  Left hand pain: A.  Anaprox DS 550 mg every 12 hours with food to help decrease pain. 3.  Advised to contact [facility] and arrange for an appointment or go to the Ortho walk-in clinic if symptoms do not improve over the next 7 to 10 days.  Final Clinical Impressions(s) / UC Diagnoses   Final diagnoses:  Left wrist pain  Left hand pain  Tendonitis of wrist, left  Ganglion of left wrist     Discharge Instructions      Advised to take the Anaprox DS 550 mg, 1 every 12 hours with food to help decrease pain and discomfort. Advised to wear the wrist brace on a regular basis to help decrease the hand pain and discomfort. Advised to use ice therapy, 5 minutes on 15 minutes off, 3-4 times throughout the day to help decrease pain and discomfort.  Advised to contact [facility] for an appointment if symptoms fail to improve within the next 7 to 10 days or worsens.  They do have a walk-in orthopedic clinic that opens up at 8 AM in the morning to 8 PM in the evening Monday-Friday.    ED Prescriptions     Medication Sig Dispense Auth. Provider   naproxen sodium (ANAPROX DS) 550 MG tablet Take 1 tablet (550 mg total) by mouth 2 (two) times daily with a meal. 20 tablet Nyoka Lint, PA-C      PDMP not reviewed this encounter.   Nyoka Lint, PA-C 01/28/23 1321

## 2023-01-28 NOTE — Discharge Instructions (Addendum)
Advised to take the Anaprox DS 550 mg, 1 every 12 hours with food to help decrease pain and discomfort. Advised to wear the wrist brace on a regular basis to help decrease the hand pain and discomfort. Advised to use ice therapy, 5 minutes on 15 minutes off, 3-4 times throughout the day to help decrease pain and discomfort.  Advised to contact [facility] for an appointment if symptoms fail to improve within the next 7 to 10 days or worsens.  They do have a walk-in orthopedic clinic that opens up at 8 AM in the morning to 8 PM in the evening Monday-Friday.

## 2024-03-09 ENCOUNTER — Other Ambulatory Visit: Payer: Self-pay

## 2024-03-09 ENCOUNTER — Encounter (HOSPITAL_COMMUNITY): Payer: Self-pay

## 2024-03-09 ENCOUNTER — Emergency Department (HOSPITAL_COMMUNITY)

## 2024-03-09 ENCOUNTER — Emergency Department (HOSPITAL_COMMUNITY)
Admission: EM | Admit: 2024-03-09 | Discharge: 2024-03-09 | Disposition: A | Discharge status: Home / Self Care | Attending: Emergency Medicine | Admitting: Emergency Medicine

## 2024-03-09 DIAGNOSIS — S93402A Sprain of unspecified ligament of left ankle, initial encounter: Secondary | ICD-10-CM | POA: Diagnosis not present

## 2024-03-09 DIAGNOSIS — M25572 Pain in left ankle and joints of left foot: Secondary | ICD-10-CM | POA: Diagnosis present

## 2024-03-09 DIAGNOSIS — M25562 Pain in left knee: Secondary | ICD-10-CM | POA: Insufficient documentation

## 2024-03-09 MED ORDER — IBUPROFEN 800 MG PO TABS
800.0000 mg | ORAL_TABLET | Freq: Once | ORAL | Status: AC
  Administered 2024-03-09: 800 mg via ORAL
  Filled 2024-03-09: qty 1

## 2024-03-09 NOTE — Progress Notes (Signed)
 Orthopedic Tech Progress Note Patient Details:  Nancy Williamson 01/30/1991 [id number]  Ortho Devices Type of Ortho Device: CAM walker, Crutches Ortho Device/Splint Location: LLE Ortho Device/Splint Interventions: Ordered, Application, Adjustment   Post Interventions Patient Tolerated: Fair Instructions Provided: Adjustment of device, Poper ambulation with device  Jerman Tinnon OTR/L 03/09/2024, 6:48 AM
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
## 2024-03-09 NOTE — ED Notes (Signed)
 Nurse and PA was trying to talk with pt about how she hurt her foot based on her triage note. Pt wouldn't answer all questions asked by either person. Conversations happen in private with no visitors at bedside.

## 2024-03-09 NOTE — Discharge Instructions (Signed)
 Home to rest. Weight bear as tolerated. Take Motrin  and Tylenol  as needed as directed for pain. Elevate the leg, apply ice with a thin cloth between ice and skin for 20 minutes at a time.  Follow up with orthopedics in 1 week if not improving.

## 2024-04-12 ENCOUNTER — Emergency Department (HOSPITAL_COMMUNITY)
Admission: EM | Admit: 2024-04-12 | Discharge: 2024-04-12 | Disposition: A | Discharge status: Home / Self Care | Attending: Emergency Medicine | Admitting: Emergency Medicine

## 2024-04-12 ENCOUNTER — Other Ambulatory Visit: Payer: Self-pay

## 2024-04-12 ENCOUNTER — Emergency Department (HOSPITAL_COMMUNITY)

## 2024-04-12 DIAGNOSIS — M545 Low back pain, unspecified: Secondary | ICD-10-CM | POA: Insufficient documentation

## 2024-04-12 DIAGNOSIS — M25552 Pain in left hip: Secondary | ICD-10-CM | POA: Diagnosis present

## 2024-04-12 DIAGNOSIS — W19XXXA Unspecified fall, initial encounter: Secondary | ICD-10-CM

## 2024-04-12 DIAGNOSIS — W108XXA Fall (on) (from) other stairs and steps, initial encounter: Secondary | ICD-10-CM | POA: Insufficient documentation

## 2024-04-12 DIAGNOSIS — M25562 Pain in left knee: Secondary | ICD-10-CM | POA: Insufficient documentation

## 2024-04-12 DIAGNOSIS — T07XXXA Unspecified multiple injuries, initial encounter: Secondary | ICD-10-CM

## 2024-04-12 LAB — COMPREHENSIVE METABOLIC PANEL WITH GFR
ALT: 29 U/L (ref 0–44)
AST: 30 U/L (ref 15–41)
Albumin: 4.2 g/dL (ref 3.5–5.0)
Alkaline Phosphatase: 48 U/L (ref 38–126)
Anion gap: 16 — ABNORMAL HIGH (ref 5–15)
BUN: 19 mg/dL (ref 6–20)
CO2: 19 mmol/L — ABNORMAL LOW (ref 22–32)
Calcium: 9.8 mg/dL (ref 8.9–10.3)
Chloride: 103 mmol/L (ref 98–111)
Creatinine, Ser: 1.09 mg/dL — ABNORMAL HIGH (ref 0.44–1.00)
GFR, Estimated: >60 mL/min
Glucose, Bld: 111 mg/dL — ABNORMAL HIGH (ref 70–99)
Potassium: 3.7 mmol/L (ref 3.5–5.1)
Sodium: 138 mmol/L (ref 135–145)
Total Bilirubin: 0.3 mg/dL (ref 0.0–1.2)
Total Protein: 8.4 g/dL — ABNORMAL HIGH (ref 6.5–8.1)

## 2024-04-12 LAB — HCG, SERUM, QUALITATIVE: Preg, Serum: NEGATIVE

## 2024-04-12 LAB — CBC
HCT: 35 % — ABNORMAL LOW (ref 36.0–46.0)
Hemoglobin: 11.6 g/dL — ABNORMAL LOW (ref 12.0–15.0)
MCH: 25.6 pg — ABNORMAL LOW (ref 26.0–34.0)
MCHC: 33.1 g/dL (ref 30.0–36.0)
MCV: 77.1 fL — ABNORMAL LOW (ref 80.0–100.0)
Platelets: 362 10*3/uL (ref 150–400)
RBC: 4.54 MIL/uL (ref 3.87–5.11)
RDW: 15.5 % (ref 11.5–15.5)
WBC: 11.5 10*3/uL — ABNORMAL HIGH (ref 4.0–10.5)
nRBC: 0 % (ref 0.0–0.2)

## 2024-04-12 MED ORDER — DIAZEPAM 5 MG PO TABS
5.0000 mg | ORAL_TABLET | Freq: Once | ORAL | Status: AC
  Administered 2024-04-12: 5 mg via ORAL
  Filled 2024-04-12: qty 1

## 2024-04-12 MED ORDER — HYDROMORPHONE HCL 1 MG/ML IJ SOLN
1.0000 mg | Freq: Once | INTRAMUSCULAR | Status: AC
  Administered 2024-04-12: 1 mg via INTRAVENOUS
  Filled 2024-04-12: qty 1

## 2024-04-12 MED ORDER — MORPHINE SULFATE 15 MG PO TABS: 7.5000 mg | ORAL_TABLET | ORAL | 0 refills | Status: DC | PRN

## 2024-04-12 MED ORDER — KETOROLAC TROMETHAMINE 15 MG/ML IJ SOLN
15.0000 mg | Freq: Once | INTRAMUSCULAR | Status: AC
  Administered 2024-04-12: 15 mg via INTRAMUSCULAR
  Filled 2024-04-12: qty 1

## 2024-04-12 MED ORDER — MORPHINE SULFATE (PF) 4 MG/ML IV SOLN
6.0000 mg | Freq: Once | INTRAVENOUS | Status: AC
  Administered 2024-04-12: 6 mg via INTRAVENOUS
  Filled 2024-04-12: qty 2

## 2024-04-12 NOTE — ED Provider Notes (Signed)
 Received patient in turnover from Dr. Lewis Red.  Please see their note for further details of Hx, PE.  Briefly patient is a 33 y.o. female with a Fall .  Patient fell down 6 steps.  Complaining of left-sided pain.  Plan to obtain plain films.  Attempt ambulation.  Plain film of the left hip knee and L-spine independently interpreted by me without fracture.  She was able to ambulate but now is complaining mostly of pain to the left knee.  There is perhaps a very mild effusion.  Unable to assess ligaments or meniscus due to discomfort.  Will place into an knee immobilizer.  Crutches.  Orthopedic follow-up.Inga Manges, Genella Kendall, DO 04/12/24 Quin Brush

## 2024-04-12 NOTE — Progress Notes (Signed)
 Orthopedic Tech Progress Note Patient Details:  Nancy Williamson June 11, 1991 [id number]  Ortho Devices Type of Ortho Device: Crutches, Knee Immobilizer Ortho Device/Splint Location: Left knee Ortho Device/Splint Interventions: Application   Post Interventions Patient Tolerated: Well  Mearl Spice Hisao Doo 04/12/2024, 7:20 PM

## 2024-04-12 NOTE — ED Notes (Signed)
 Admits marijuana use. Denies other drugs

## 2024-04-12 NOTE — Discharge Instructions (Addendum)
  Your x-rays do not show an obvious broken bone.  Please follow-up with orthopedic doctor in the office.  I would have you call them in the next business day to set up an appointment.  Try to keep your weight off of your leg as best you can. Take 4 over the counter ibuprofen  tablets 3 times a day or 2 over-the-counter naproxen  tablets twice a day for pain. Also take tylenol  1000mg (2 extra strength) four times a day.   Then take the pain medicine if you feel like you need it. Narcotics do not help with the pain, they only make you care about it less.  You can become addicted to this, people may break into your house to steal it.  It will constipate you.  If you drive under the influence of this medicine you can get a DUI.

## 2024-04-12 NOTE — ED Triage Notes (Signed)
 Pt BIBA from home for suspected fall, when asked repeatedly pt eventually states she fell down about 6 stairs. Unknown head trauma. Cannot weight bear. Pain seems to be coming in waves. ETOH last night. VSS

## 2024-04-12 NOTE — ED Provider Notes (Signed)
 [place] [facility] AT [facility] Provider Note   CSN: [id number] Arrival date & time: 04/12/24  1212     History  Chief Complaint  Patient presents with   Alan Hoyer Lechtenberg is a 33 y.o. female.  HPI    33 year old female comes in with chief complaint of mechanical fall. Patient states that she was coming down, she fell down 6 stairs.  She does not know if she had any head trauma.  She denies loss of consciousness.  Patient is complaining of pain primarily over the left lower extremity and back.  She denies any headache, neck pain, numbness, tingling.  Home Medications Prior to Admission medications   Medication Sig Start Date End Date Taking? Authorizing Provider  acetaminophen  (TYLENOL ) 500 MG tablet Take 1 tablet (500 mg total) by mouth every 6 (six) hours as needed. 01/14/16   Roe Clare, MD  naproxen  sodium (ANAPROX  DS) 550 MG tablet Take 1 tablet (550 mg total) by mouth 2 (two) times daily with a meal. 01/28/23   Gretel Leaven, PA-C  ondansetron  (ZOFRAN  ODT) 8 MG disintegrating tablet Take 1 tablet (8 mg total) by mouth every 8 (eight) hours as needed for nausea or vomiting. 01/21/18   Auston Blush, MD  ondansetron  (ZOFRAN ) 4 MG tablet Take 1 tablet (4 mg total) by mouth every 6 (six) hours. 01/14/16   Roe Clare, MD  oxyCODONE  (ROXICODONE ) 5 MG immediate release tablet Take 1 tablet (5 mg total) by mouth every 4 (four) hours as needed for severe pain (pain score 7-10). 04/13/24   Teddi Favors, DO      Allergies    Patient has no known allergies.    Review of Systems   Review of Systems  All other systems reviewed and are negative.   Physical Exam Updated Vital Signs BP 110/77 (BP Location: Right Arm)   Pulse 94   Temp (!) 97.5 F (36.4 C) (Oral)   Resp 18   SpO2 98%  Physical Exam Vitals and nursing note reviewed.  Constitutional:      Appearance: She is well-developed.  HENT:     Head: Atraumatic.  Eyes:      Extraocular Movements: Extraocular movements intact.     Pupils: Pupils are equal, round, and reactive to light.  Neck:     Comments: No midline c-spine tenderness, pt able to turn head to 45 degrees bilaterally without any pain and able to flex neck to the chest and extend without any pain or neurologic symptoms.  Cardiovascular:     Rate and Rhythm: Normal rate.  Pulmonary:     Effort: Pulmonary effort is normal.  Musculoskeletal:     Cervical back: Normal range of motion and neck supple.     Comments: Patient has reproducible tenderness with palpation over the left hip, left thigh, knee, tibia and even the foot.  She has tenderness with palpation of the lower spine over the midline.  No abdominal tenderness, no crepitus, no deformity  Skin:    General: Skin is warm and dry.  Neurological:     Mental Status: She is alert and oriented to person, place, and time.     ED Results / Procedures / Treatments   Labs (all labs ordered are listed, but only abnormal results are displayed) Labs Reviewed  CBC - Abnormal; Notable for the following components:      Result Value   WBC 11.5 (*)    Hemoglobin 11.6 (*)  HCT 35.0 (*)    MCV 77.1 (*)    MCH 25.6 (*)    All other components within normal limits  COMPREHENSIVE METABOLIC PANEL WITH GFR - Abnormal; Notable for the following components:   CO2 19 (*)    Glucose, Bld 111 (*)    Creatinine, Ser 1.09 (*)    Total Protein 8.4 (*)    Anion gap 16 (*)    All other components within normal limits  HCG, SERUM, QUALITATIVE    EKG None  Radiology DG Chest 1 View Result Date: 04/12/2024 CLINICAL DATA:  Fall with back pain EXAM: CHEST  1 VIEW COMPARISON:  01/21/2018 FINDINGS: The heart size and mediastinal contours are within normal limits. Both lungs are clear. The visualized skeletal structures are unremarkable. Low lung volumes. IMPRESSION: No active disease. Electronically Signed   By: Esmeralda Hedge M.D.   On: 04/12/2024 17:43    DG Thoracic Spine 2 View Result Date: 04/12/2024 CLINICAL DATA:  Fall with back pain EXAM: THORACIC SPINE 2 VIEWS COMPARISON:  None Available. FINDINGS: There is no evidence of thoracic spine fracture. Alignment is normal. No other significant bone abnormalities are identified. IMPRESSION: Negative. Electronically Signed   By: Esmeralda Hedge M.D.   On: 04/12/2024 17:42   DG Lumbar Spine Complete Result Date: 04/12/2024 CLINICAL DATA:  Fall with back pain EXAM: LUMBAR SPINE - COMPLETE 4+ VIEW COMPARISON:  11/02/2008 FINDINGS: There is no evidence of lumbar spine fracture. Alignment is normal. Intervertebral disc spaces are maintained. IMPRESSION: Negative. Electronically Signed   By: Esmeralda Hedge M.D.   On: 04/12/2024 17:42   DG Hip Unilat With Pelvis 2-3 Views Left Result Date: 04/12/2024 CLINICAL DATA:  Pain after fall EXAM: DG HIP (WITH OR WITHOUT PELVIS) 4V LEFT COMPARISON:  None Available. FINDINGS: There is no evidence of hip fracture or dislocation. There is no evidence of arthropathy or other focal bone abnormality. Well defined densities in the pelvis are indeterminate although possibly vascular. IMPRESSION: No acute osseous abnormality. Electronically Signed   By: Adrianna Horde M.D.   On: 04/12/2024 17:16   DG Ankle Complete Left Result Date: 04/12/2024 CLINICAL DATA:  Pain after fall EXAM: LEFT ANKLE COMPLETE - 3+ VIEW COMPARISON:  None Available. FINDINGS: There is no evidence of fracture, dislocation, or joint effusion. There is no evidence of arthropathy or other focal bone abnormality. Soft tissues are unremarkable. IMPRESSION: No acute osseous abnormality. Electronically Signed   By: Adrianna Horde M.D.   On: 04/12/2024 17:14   DG Knee Complete 4 Views Left Result Date: 04/12/2024 CLINICAL DATA:  Pain after fall EXAM: LEFT KNEE - COMPLETE 4 VIEW COMPARISON:  None Available. FINDINGS: No evidence of fracture, dislocation, or joint effusion. No evidence of arthropathy or other focal bone  abnormality. Soft tissues are unremarkable. IMPRESSION: No acute osseous abnormality. Electronically Signed   By: Adrianna Horde M.D.   On: 04/12/2024 17:13    Procedures Procedures    Medications Ordered in ED Medications  HYDROmorphone  (DILAUDID ) injection 1 mg (1 mg Intravenous Given 04/12/24 1314)  diazepam (VALIUM) tablet 5 mg (5 mg Oral Given 04/12/24 1314)  morphine  (PF) 4 MG/ML injection 6 mg (6 mg Intravenous Given 04/12/24 1539)  ketorolac  (TORADOL ) 15 MG/ML injection 15 mg (15 mg Intramuscular Given 04/12/24 1713)    ED Course/ Medical Decision Making/ A&P  Medical Decision Making Amount and/or Complexity of Data Reviewed Labs: ordered. Radiology: ordered.  Risk Prescription drug management.   33 year old patient comes in after sustaining what appears to be a mechanical fall. Pertinent past medical includes.  No past medical history.  Patient denies any blood thinners. Pt has no nausea, vomiting, seizures, loss of consciousness or new visual complains, weakness, numbness, dizziness or gait instability.  Based on my history and exam, differential diagnosis includes: - Knee/ankle/hip fracture - Long bone fractures - Contusions - Soft tissue injury - Concussion  Based on the initial assessment, the following workup was initiated -x-ray of the left lower extremity and lumbar spine.  I feel comfortable clearing the brain and C-spine clinically.  Her care has been signed out to incoming team.  Dispo per imaging.  Final Clinical Impression(s) / ED Diagnoses Final diagnoses:  Acute pain of left knee  Fall, initial encounter  Multiple contusions  Pain of left hip    Rx / DC Orders ED Discharge Orders          Ordered    morphine  (MSIR) 15 MG tablet  Every 4 hours PRN,   Status:  Discontinued        04/12/24 1837              Deatra Face, MD 04/13/24 1604

## 2024-04-13 ENCOUNTER — Telehealth (HOSPITAL_COMMUNITY): Payer: Self-pay | Admitting: Emergency Medicine

## 2024-04-13 MED ORDER — OXYCODONE HCL 5 MG PO TABS: 5.0000 mg | ORAL_TABLET | ORAL | 0 refills | Status: DC | PRN

## 2024-04-13 NOTE — Telephone Encounter (Signed)
 Pharmacy did not have medication in stock, change to roxicodone . Spoke w/ pharmacist to cancel prior rx

## 2024-11-10 ENCOUNTER — Encounter (HOSPITAL_COMMUNITY): Payer: Self-pay

## 2024-11-10 ENCOUNTER — Emergency Department (HOSPITAL_COMMUNITY)

## 2024-11-10 ENCOUNTER — Other Ambulatory Visit: Payer: Self-pay

## 2024-11-10 ENCOUNTER — Emergency Department (HOSPITAL_COMMUNITY)
Admission: EM | Admit: 2024-11-10 | Discharge: 2024-11-10 | Disposition: A | Discharge status: Home / Self Care | Attending: Emergency Medicine | Admitting: Emergency Medicine

## 2024-11-10 DIAGNOSIS — D649 Anemia, unspecified: Secondary | ICD-10-CM | POA: Diagnosis not present

## 2024-11-10 DIAGNOSIS — S0083XA Contusion of other part of head, initial encounter: Secondary | ICD-10-CM | POA: Diagnosis not present

## 2024-11-10 DIAGNOSIS — S299XXA Unspecified injury of thorax, initial encounter: Secondary | ICD-10-CM | POA: Insufficient documentation

## 2024-11-10 DIAGNOSIS — S3991XA Unspecified injury of abdomen, initial encounter: Secondary | ICD-10-CM | POA: Diagnosis not present

## 2024-11-10 DIAGNOSIS — D72829 Elevated white blood cell count, unspecified: Secondary | ICD-10-CM | POA: Insufficient documentation

## 2024-11-10 DIAGNOSIS — M79605 Pain in left leg: Secondary | ICD-10-CM | POA: Diagnosis not present

## 2024-11-10 DIAGNOSIS — M542 Cervicalgia: Secondary | ICD-10-CM | POA: Diagnosis not present

## 2024-11-10 DIAGNOSIS — Y907 Blood alcohol level of 200-239 mg/100 ml: Secondary | ICD-10-CM | POA: Insufficient documentation

## 2024-11-10 DIAGNOSIS — T07XXXA Unspecified multiple injuries, initial encounter: Secondary | ICD-10-CM

## 2024-11-10 DIAGNOSIS — S0990XA Unspecified injury of head, initial encounter: Secondary | ICD-10-CM | POA: Diagnosis present

## 2024-11-10 LAB — COMPREHENSIVE METABOLIC PANEL WITH GFR
ALT: 25 U/L (ref 0–44)
AST: 29 U/L (ref 15–41)
Albumin: 4.4 g/dL (ref 3.5–5.0)
Alkaline Phosphatase: 58 U/L (ref 38–126)
Anion gap: 15 (ref 5–15)
BUN: 11 mg/dL (ref 6–20)
CO2: 22 mmol/L (ref 22–32)
Calcium: 6.5 mg/dL — ABNORMAL LOW (ref 8.9–10.3)
Chloride: 102 mmol/L (ref 98–111)
Creatinine, Ser: 0.74 mg/dL (ref 0.44–1.00)
GFR, Estimated: >60 mL/min
Glucose, Bld: 89 mg/dL (ref 70–99)
Potassium: 3.7 mmol/L (ref 3.5–5.1)
Sodium: 139 mmol/L (ref 135–145)
Total Bilirubin: 0.2 mg/dL (ref 0.0–1.2)
Total Protein: 8.1 g/dL (ref 6.5–8.1)

## 2024-11-10 LAB — I-STAT CHEM 8, ED
BUN: 11 mg/dL (ref 6–20)
Calcium, Ion: 1.06 mmol/L — ABNORMAL LOW (ref 1.15–1.40)
Chloride: 105 mmol/L (ref 98–111)
Creatinine, Ser: 1 mg/dL (ref 0.44–1.00)
Glucose, Bld: 91 mg/dL (ref 70–99)
HCT: 40 % (ref 36.0–46.0)
Hemoglobin: 13.6 g/dL (ref 12.0–15.0)
Potassium: 3.6 mmol/L (ref 3.5–5.1)
Sodium: 142 mmol/L (ref 135–145)
TCO2: 21 mmol/L — ABNORMAL LOW (ref 22–32)

## 2024-11-10 LAB — CBC
HCT: 35.9 % — ABNORMAL LOW (ref 36.0–46.0)
Hemoglobin: 11.7 g/dL — ABNORMAL LOW (ref 12.0–15.0)
MCH: 25.2 pg — ABNORMAL LOW (ref 26.0–34.0)
MCHC: 32.6 g/dL (ref 30.0–36.0)
MCV: 77.4 fL — ABNORMAL LOW (ref 80.0–100.0)
Platelets: 386 K/uL (ref 150–400)
RBC: 4.64 MIL/uL (ref 3.87–5.11)
RDW: 15 % (ref 11.5–15.5)
WBC: 11.8 K/uL — ABNORMAL HIGH (ref 4.0–10.5)
nRBC: 0 % (ref 0.0–0.2)

## 2024-11-10 LAB — I-STAT CG4 LACTIC ACID, ED: Lactic Acid, Venous: 2.9 mmol/L (ref 0.5–1.9)

## 2024-11-10 LAB — PROTIME-INR
INR: 1 (ref 0.8–1.2)
Prothrombin Time: 13.2 s (ref 11.4–15.2)

## 2024-11-10 LAB — SAMPLE TO BLOOD BANK

## 2024-11-10 LAB — ETHANOL: Alcohol, Ethyl (B): 223 mg/dL — ABNORMAL HIGH

## 2024-11-10 MED ORDER — SODIUM CHLORIDE 0.9 % IV BOLUS
125.0000 mL | Freq: Once | INTRAVENOUS | Status: AC
  Administered 2024-11-10: 125 mL via INTRAVENOUS

## 2024-11-10 MED ORDER — HYDROMORPHONE HCL 1 MG/ML IJ SOLN: 1.0000 mg | Freq: Once | INTRAMUSCULAR | Status: DC

## 2024-11-10 MED ORDER — OXYCODONE HCL 5 MG PO TABS: 2.5000 mg | ORAL_TABLET | Freq: Four times a day (QID) | ORAL | 0 refills | Status: AC | PRN

## 2024-11-10 MED ORDER — OXYCODONE HCL 5 MG PO TABS
5.0000 mg | ORAL_TABLET | Freq: Once | ORAL | Status: AC
  Administered 2024-11-10: 5 mg via ORAL
  Filled 2024-11-10: qty 1

## 2024-11-10 MED ORDER — KETOROLAC TROMETHAMINE 15 MG/ML IJ SOLN
15.0000 mg | Freq: Once | INTRAMUSCULAR | Status: AC
  Administered 2024-11-10: 15 mg via INTRAVENOUS
  Filled 2024-11-10: qty 1

## 2024-11-10 MED ORDER — ONDANSETRON HCL 4 MG/2ML IJ SOLN
4.0000 mg | Freq: Once | INTRAMUSCULAR | Status: AC
  Administered 2024-11-10: 4 mg via INTRAVENOUS
  Filled 2024-11-10: qty 2

## 2024-11-10 MED ORDER — HYDROMORPHONE HCL 1 MG/ML IJ SOLN
0.5000 mg | Freq: Once | INTRAMUSCULAR | Status: AC
  Administered 2024-11-10: 0.5 mg via INTRAVENOUS
  Filled 2024-11-10: qty 1

## 2024-11-10 MED ORDER — ACETAMINOPHEN 500 MG PO TABS
1000.0000 mg | ORAL_TABLET | Freq: Once | ORAL | Status: AC
  Administered 2024-11-10: 1000 mg via ORAL
  Filled 2024-11-10: qty 2

## 2024-11-10 MED ORDER — IOHEXOL 350 MG/ML SOLN
75.0000 mL | Freq: Once | INTRAVENOUS | Status: AC | PRN
  Administered 2024-11-10: 75 mL via INTRAVENOUS
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
## 2024-11-10 NOTE — ED Notes (Signed)
 [facility] notified of no visitors for patient.

## 2024-11-10 NOTE — ED Notes (Signed)
Patient returned from CT to room at this time. ?
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
## 2024-11-10 NOTE — ED Notes (Signed)
 PD in room talking with patient.
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
## 2024-11-10 NOTE — ED Notes (Signed)
 Patient transported to CT
